# Patient Record
Sex: Male | Born: 1935 | Race: Black or African American | Hispanic: No | Marital: Married
Health system: Southern US, Community
[De-identification: ages and names within clinical notes are randomized; demographics above are authoritative.]

---

## 1998-01-12 ENCOUNTER — Encounter: Payer: Self-pay | Admitting: Internal Medicine

## 1998-01-12 ENCOUNTER — Ambulatory Visit (HOSPITAL_COMMUNITY): Admission: RE | Admit: 1998-01-12 | Discharge: 1998-01-12 | Payer: Self-pay | Admitting: Internal Medicine

## 2000-05-28 ENCOUNTER — Encounter: Admission: RE | Admit: 2000-05-28 | Discharge: 2000-05-28 | Payer: Self-pay | Admitting: Internal Medicine

## 2000-05-28 ENCOUNTER — Encounter: Payer: Self-pay | Admitting: Internal Medicine

## 2000-08-14 ENCOUNTER — Inpatient Hospital Stay (HOSPITAL_COMMUNITY): Admission: AD | Admit: 2000-08-14 | Discharge: 2000-08-17 | Payer: Self-pay | Admitting: Internal Medicine

## 2000-08-15 ENCOUNTER — Encounter: Payer: Self-pay | Admitting: Internal Medicine

## 2000-08-16 ENCOUNTER — Encounter: Payer: Self-pay | Admitting: Internal Medicine

## 2004-01-19 ENCOUNTER — Encounter: Admission: RE | Admit: 2004-01-19 | Discharge: 2004-01-19 | Payer: Self-pay | Admitting: Internal Medicine

## 2004-01-27 ENCOUNTER — Encounter: Admission: RE | Admit: 2004-01-27 | Discharge: 2004-01-27 | Payer: Self-pay | Admitting: Internal Medicine

## 2004-03-01 ENCOUNTER — Encounter (INDEPENDENT_AMBULATORY_CARE_PROVIDER_SITE_OTHER): Payer: Self-pay | Admitting: Specialist

## 2004-03-01 ENCOUNTER — Ambulatory Visit (HOSPITAL_COMMUNITY): Admission: RE | Admit: 2004-03-01 | Discharge: 2004-03-01 | Payer: Self-pay | Admitting: Gastroenterology

## 2004-03-21 ENCOUNTER — Ambulatory Visit (HOSPITAL_COMMUNITY): Admission: RE | Admit: 2004-03-21 | Discharge: 2004-03-21 | Payer: Self-pay | Admitting: Internal Medicine

## 2004-03-21 ENCOUNTER — Inpatient Hospital Stay (HOSPITAL_COMMUNITY): Admission: EM | Admit: 2004-03-21 | Discharge: 2004-03-28 | Payer: Self-pay | Admitting: Emergency Medicine

## 2004-03-24 ENCOUNTER — Ambulatory Visit: Payer: Self-pay | Admitting: Internal Medicine

## 2004-04-12 ENCOUNTER — Ambulatory Visit (HOSPITAL_COMMUNITY): Admission: RE | Admit: 2004-04-12 | Discharge: 2004-04-12 | Payer: Self-pay | Admitting: Urology

## 2004-05-17 ENCOUNTER — Ambulatory Visit (HOSPITAL_COMMUNITY): Admission: RE | Admit: 2004-05-17 | Discharge: 2004-05-17 | Payer: Self-pay | Admitting: Urology

## 2004-10-24 ENCOUNTER — Emergency Department (HOSPITAL_COMMUNITY): Admission: EM | Admit: 2004-10-24 | Discharge: 2004-10-24 | Payer: Self-pay | Admitting: Emergency Medicine

## 2004-10-31 ENCOUNTER — Inpatient Hospital Stay (HOSPITAL_COMMUNITY): Admission: EM | Admit: 2004-10-31 | Discharge: 2004-11-24 | Payer: Self-pay | Admitting: Internal Medicine

## 2004-11-11 ENCOUNTER — Ambulatory Visit: Payer: Self-pay | Admitting: Internal Medicine

## 2005-05-05 ENCOUNTER — Encounter: Admission: RE | Admit: 2005-05-05 | Discharge: 2005-05-05 | Payer: Self-pay | Admitting: Internal Medicine

## 2005-09-05 ENCOUNTER — Inpatient Hospital Stay (HOSPITAL_COMMUNITY): Admission: AD | Admit: 2005-09-05 | Discharge: 2005-09-25 | Payer: Self-pay | Admitting: Internal Medicine

## 2005-09-06 ENCOUNTER — Encounter (INDEPENDENT_AMBULATORY_CARE_PROVIDER_SITE_OTHER): Payer: Self-pay | Admitting: Interventional Cardiology

## 2005-11-09 ENCOUNTER — Inpatient Hospital Stay (HOSPITAL_COMMUNITY): Admission: EM | Admit: 2005-11-09 | Discharge: 2005-11-16 | Payer: Self-pay | Admitting: Emergency Medicine

## 2006-01-05 ENCOUNTER — Inpatient Hospital Stay (HOSPITAL_COMMUNITY): Admission: EM | Admit: 2006-01-05 | Discharge: 2006-01-12 | Payer: Self-pay | Admitting: Emergency Medicine

## 2006-03-11 ENCOUNTER — Inpatient Hospital Stay (HOSPITAL_COMMUNITY): Admission: EM | Admit: 2006-03-11 | Discharge: 2006-03-21 | Payer: Self-pay | Admitting: Emergency Medicine

## 2006-03-12 ENCOUNTER — Encounter (INDEPENDENT_AMBULATORY_CARE_PROVIDER_SITE_OTHER): Payer: Self-pay | Admitting: Cardiology

## 2006-04-18 ENCOUNTER — Inpatient Hospital Stay (HOSPITAL_COMMUNITY): Admission: EM | Admit: 2006-04-18 | Discharge: 2006-04-30 | Payer: Self-pay | Admitting: Emergency Medicine

## 2006-04-19 ENCOUNTER — Encounter: Payer: Self-pay | Admitting: Vascular Surgery

## 2006-04-19 ENCOUNTER — Ambulatory Visit: Payer: Self-pay | Admitting: Vascular Surgery

## 2006-05-29 ENCOUNTER — Ambulatory Visit (HOSPITAL_COMMUNITY): Admission: RE | Admit: 2006-05-29 | Discharge: 2006-05-29 | Payer: Self-pay | Admitting: Internal Medicine

## 2006-12-01 ENCOUNTER — Emergency Department (HOSPITAL_COMMUNITY): Admission: EM | Admit: 2006-12-01 | Discharge: 2006-12-02 | Payer: Self-pay | Admitting: Emergency Medicine

## 2006-12-15 ENCOUNTER — Emergency Department (HOSPITAL_COMMUNITY): Admission: EM | Admit: 2006-12-15 | Discharge: 2006-12-15 | Payer: Self-pay | Admitting: Emergency Medicine

## 2007-01-03 ENCOUNTER — Ambulatory Visit: Payer: Self-pay | Admitting: Internal Medicine

## 2007-01-03 ENCOUNTER — Inpatient Hospital Stay (HOSPITAL_COMMUNITY): Admission: EM | Admit: 2007-01-03 | Discharge: 2007-01-17 | Payer: Self-pay | Admitting: Emergency Medicine

## 2007-01-15 ENCOUNTER — Ambulatory Visit: Payer: Self-pay | Admitting: Internal Medicine

## 2007-01-16 ENCOUNTER — Encounter: Payer: Self-pay | Admitting: Internal Medicine

## 2007-02-27 ENCOUNTER — Ambulatory Visit: Payer: Self-pay

## 2007-08-23 ENCOUNTER — Inpatient Hospital Stay (HOSPITAL_COMMUNITY): Admission: EM | Admit: 2007-08-23 | Discharge: 2007-08-30 | Payer: Self-pay | Admitting: Emergency Medicine

## 2007-11-18 ENCOUNTER — Emergency Department (HOSPITAL_COMMUNITY): Admission: EM | Admit: 2007-11-18 | Discharge: 2007-11-18 | Payer: Self-pay | Admitting: Emergency Medicine

## 2008-02-10 ENCOUNTER — Inpatient Hospital Stay (HOSPITAL_COMMUNITY): Admission: AD | Admit: 2008-02-10 | Discharge: 2008-02-19 | Payer: Self-pay | Admitting: Internal Medicine

## 2008-02-12 ENCOUNTER — Encounter: Payer: Self-pay | Admitting: Internal Medicine

## 2008-02-14 ENCOUNTER — Ambulatory Visit: Payer: Self-pay | Admitting: Vascular Surgery

## 2008-05-12 ENCOUNTER — Inpatient Hospital Stay (HOSPITAL_COMMUNITY): Admission: EM | Admit: 2008-05-12 | Discharge: 2008-05-22 | Payer: Self-pay | Admitting: Emergency Medicine

## 2008-05-12 ENCOUNTER — Encounter: Payer: Self-pay | Admitting: Internal Medicine

## 2008-05-18 ENCOUNTER — Encounter (INDEPENDENT_AMBULATORY_CARE_PROVIDER_SITE_OTHER): Payer: Self-pay | Admitting: *Deleted

## 2008-06-11 ENCOUNTER — Ambulatory Visit: Payer: Self-pay | Admitting: Vascular Surgery

## 2008-07-04 ENCOUNTER — Inpatient Hospital Stay (HOSPITAL_COMMUNITY): Admission: EM | Admit: 2008-07-04 | Discharge: 2008-07-08 | Payer: Self-pay | Admitting: Emergency Medicine

## 2008-08-30 DEATH — deceased

## 2009-01-01 ENCOUNTER — Encounter (INDEPENDENT_AMBULATORY_CARE_PROVIDER_SITE_OTHER): Payer: Self-pay | Admitting: *Deleted

## 2009-08-27 ENCOUNTER — Encounter (INDEPENDENT_AMBULATORY_CARE_PROVIDER_SITE_OTHER): Payer: Self-pay | Admitting: *Deleted

## 2010-02-06 IMAGING — CT CT HEAD W/O CM
1 of 2 series · 14 of 30 positions shown, 18 images · non-contrast
Comparison: Head CT scan 05/11/2008 and 02/10/2008.

CLINICAL DATA: Altered mental status.  Patient unresponsive.

CT HEAD WITHOUT CONTRAST
TECHNIQUE: Contiguous axial images were obtained from the base of
the skull through the vertex without contrast.

[Series 2: head_seq 4.5 h37s st · axial · 0.43mm/px · z∈[-146,-11]mm · 14 of 36 slices shown, 18 images]
[im 3/36  brain]
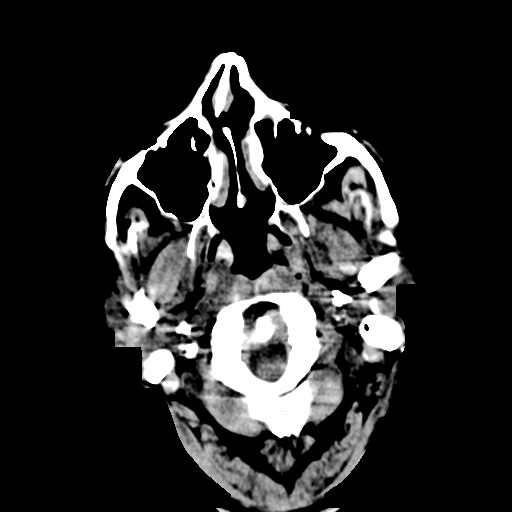
[im 3/36  bone]
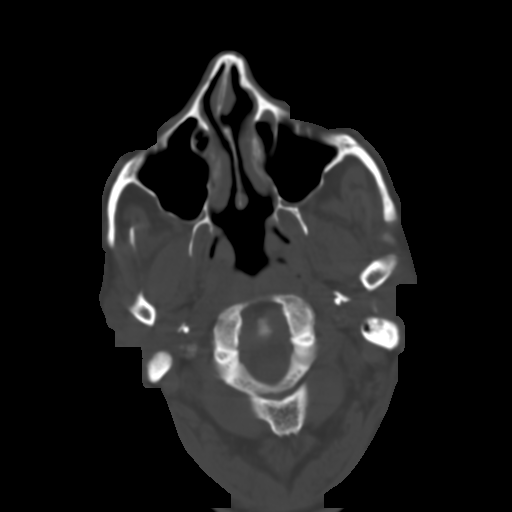
[im 5/36  brain]
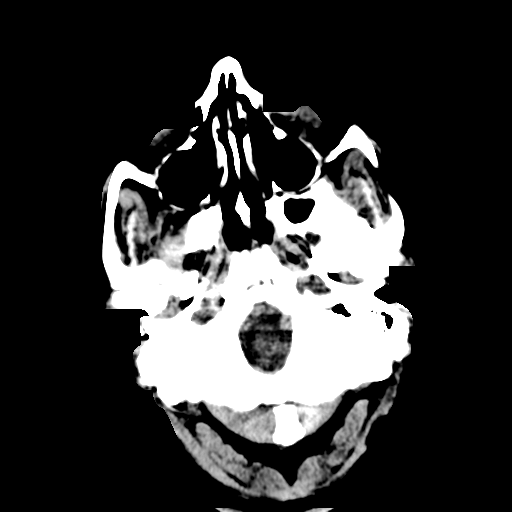
[im 8/36  brain]
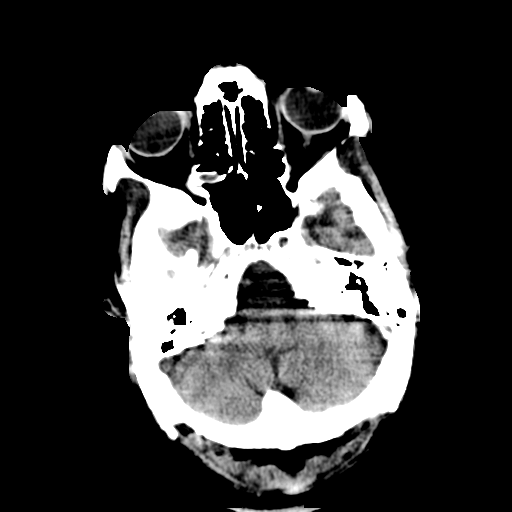
[im 10/36  brain]
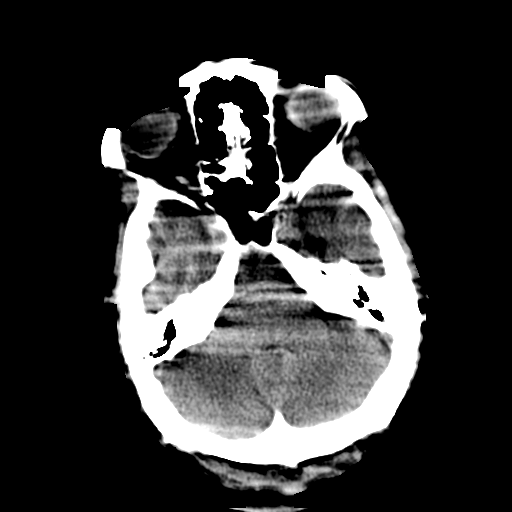
[im 12/36  brain]
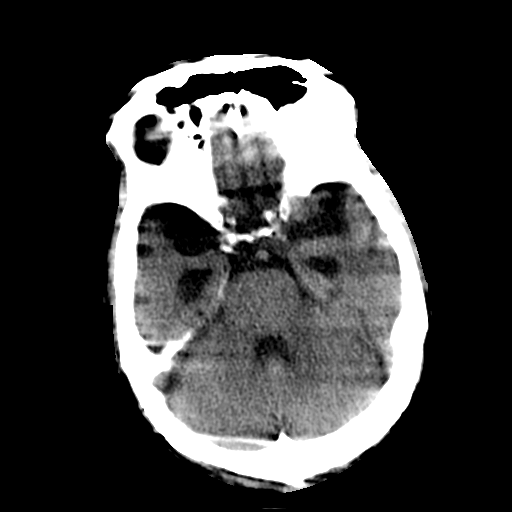
[im 12/36  bone]
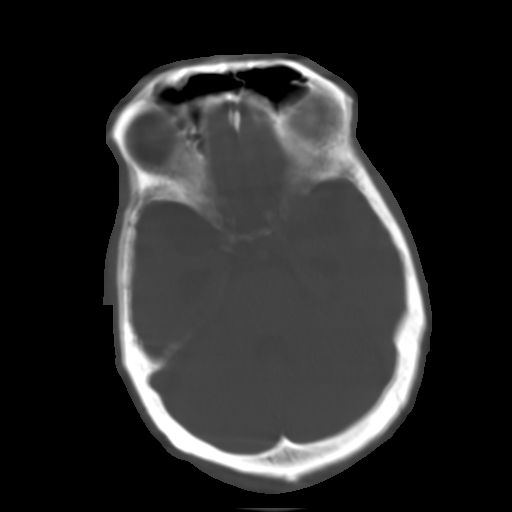
[im 15/36  brain]
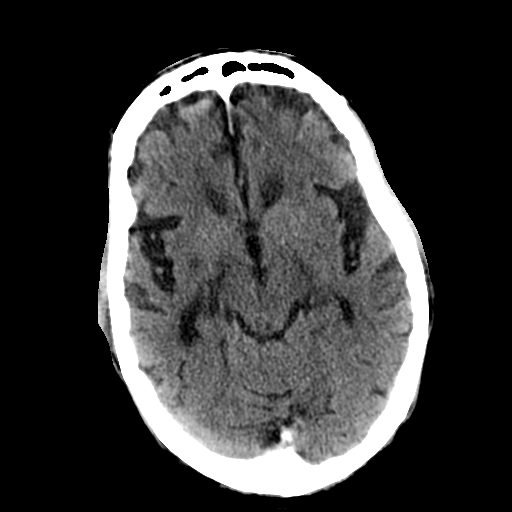
[im 17/36  brain]
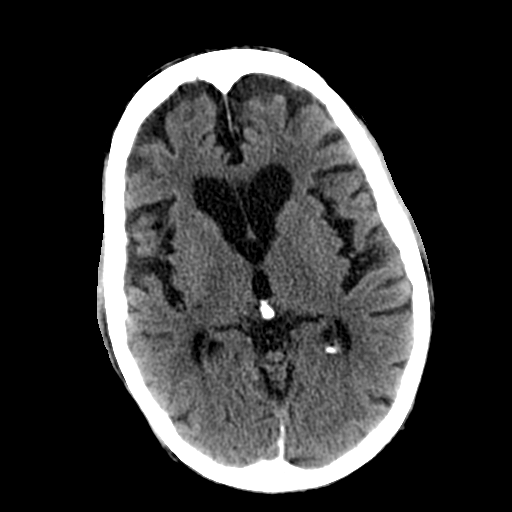
[im 19/36  brain]
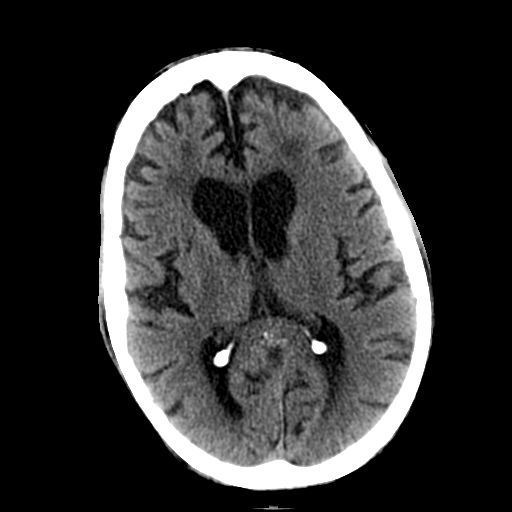
[im 22/36  brain]
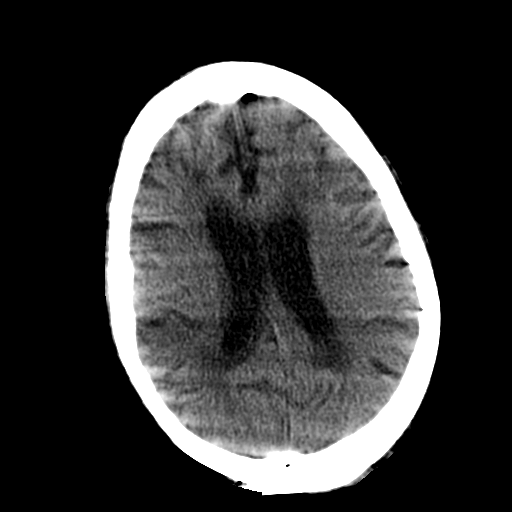
[im 22/36  bone]
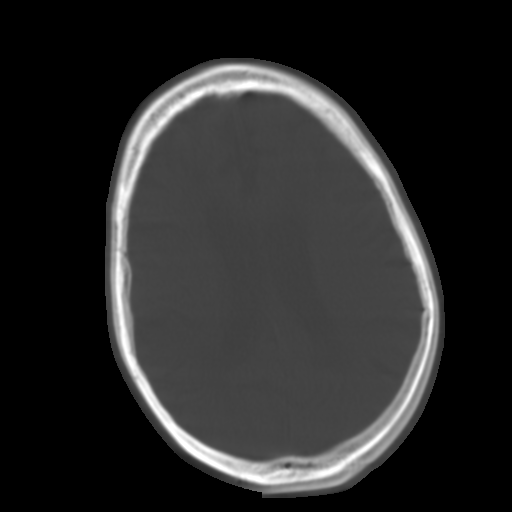
[im 24/36  brain]
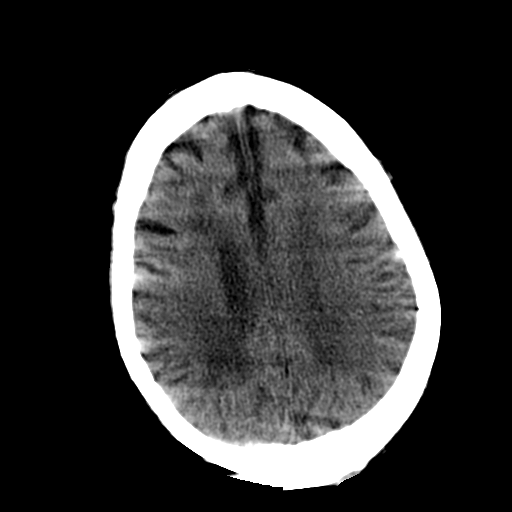
[im 26/36  brain]
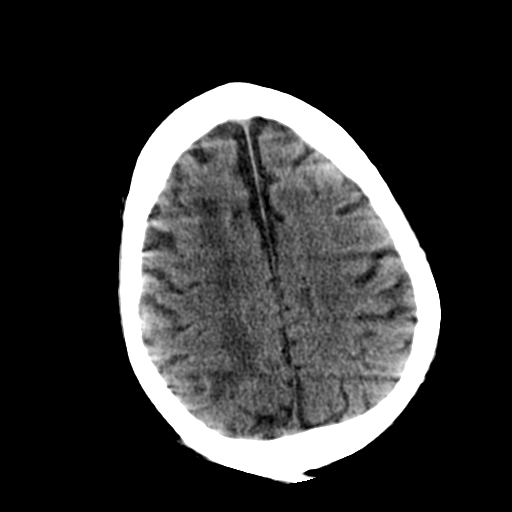
[im 29/36  brain]
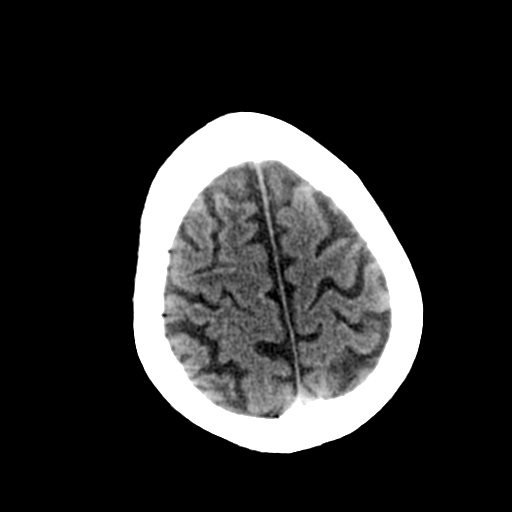
[im 31/36  brain]
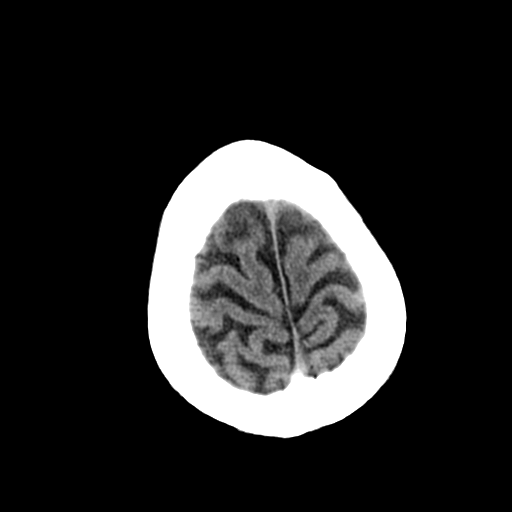
[im 31/36  bone]
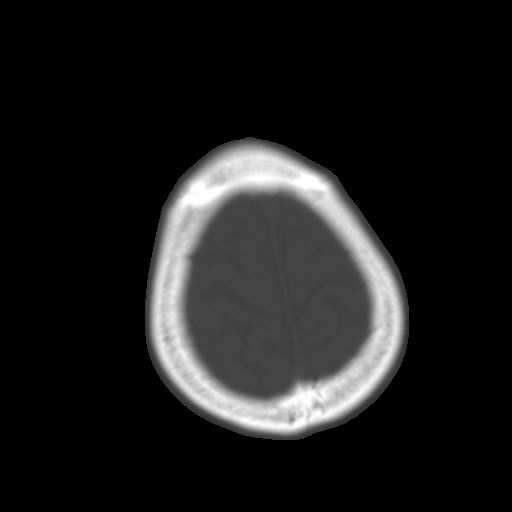
[im 33/36  brain]
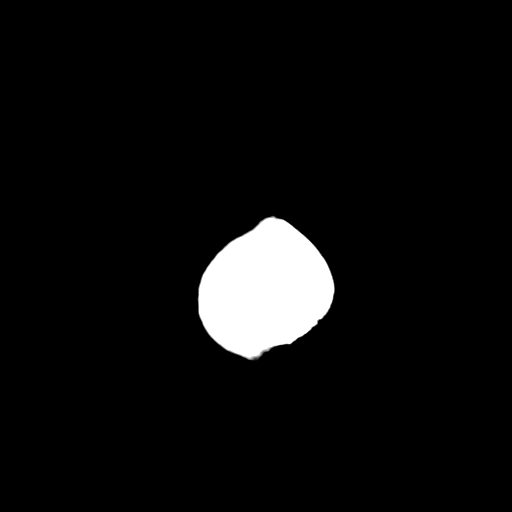

[14 of 30 positions shown; findings below may reference images not displayed]

FINDINGS: The brain is atrophic with a pattern of chronic
microvascular ischemic change.  Remote infarct of the posterior
high right parietal lobe is noted.  No evidence of acute
intracranial abnormality including acute infarction, hemorrhage,
mass lesion, mass effect, midline shift or abnormal extra-axial
fluid collection is identified.  There is no hydrocephalus.  Imaged
paranasal sinuses and mastoid air cells are clear.
IMPRESSION: 1.  No acute finding.
2.  Atrophy, chronic microvascular ischemic change and small,
remote right parietal infarct.

## 2010-02-24 ENCOUNTER — Encounter (INDEPENDENT_AMBULATORY_CARE_PROVIDER_SITE_OTHER): Payer: Self-pay | Admitting: *Deleted

## 2010-03-03 NOTE — Letter (Signed)
Summary: Appointment - Reminder 2  [facility], Main Office  [address]   [place], [place] [address]   Phone: [phone]  Fax: [phone]     August 27, 2009 MRN: [id number]   [name] [place] [address] [place], [place]  [address]   Dear [name],  Our records indicate that it is time to schedule a follow-up appointment.Dr.Taylor(pacer check)recommended that you follow up with [place]. It is very important that we reach you to schedule this appointment. We look forward to participating in your health care needs. Please contact us at the number listed above at your earliest convenience to schedule your appointment.  If you are unable to make an appointment at this time, give [place] a call so we can update our records.     Sincerely,  Ruel Favors Scheduling Team

## 2010-03-03 NOTE — Letter (Signed)
Summary: Device-Delinquent Check  [facility], Main Office  [address]   [place], [place] [address]   Phone: [phone]  Fax: [phone]     February 24, 2010 MRN: [id number]   [facility] [address] [place], [place]  [address]   Dear Mr. Azer,  According to our records, you have not had your implanted device checked in the recommended period of time.  We are unable to determine appropriate device function without checking your device on a regular basis.  Please call our office to schedule an appointment, with Dr Ladona Ridgel,  as soon as possible.  If you are having your device checked by another physician, please call us so that we may update our records.  Thank you,  Letta Moynahan, EMT  February 24, 2010 12:38 PM  [facility] certified

## 2010-03-15 ENCOUNTER — Encounter: Payer: Self-pay | Admitting: Internal Medicine

## 2010-03-29 NOTE — Cardiovascular Report (Signed)
Summary: Certified Letter Signed - Patient (not doing f/u)  Certified Letter Signed - Patient (not doing f/u)   Imported By: Debby Freiberg 03/23/2010 13:19:45  _____________________________________________________________________  External Attachment:    Type:   Image     Comment:   External Document

## 2010-05-09 LAB — COMPREHENSIVE METABOLIC PANEL
ALT: 16 U/L (ref 0–53)
AST: 26 U/L (ref 0–37)
Albumin: 3.4 g/dL — ABNORMAL LOW (ref 3.5–5.2)
Calcium: 9.5 mg/dL (ref 8.4–10.5)
GFR calc Af Amer: 51 mL/min — ABNORMAL LOW (ref >60)
Glucose, Bld: 172 mg/dL — ABNORMAL HIGH (ref 70–99)
Potassium: 4.3 mEq/L (ref 3.5–5.1)
Sodium: 138 mEq/L (ref 135–145)
Total Protein: 7 g/dL (ref 6.0–8.3)

## 2010-05-09 LAB — DIFFERENTIAL
Eosinophils Absolute: 0 10*3/uL (ref 0.0–0.7)
Lymphs Abs: 0.7 10*3/uL (ref 0.7–4.0)
Monocytes Absolute: 0.4 10*3/uL (ref 0.1–1.0)
Monocytes Relative: 6 % (ref 3–12)
Neutrophils Relative %: 83 % — ABNORMAL HIGH (ref 43–77)

## 2010-05-09 LAB — BASIC METABOLIC PANEL
BUN: 16 mg/dL (ref 6–23)
CO2: 25 mEq/L (ref 19–32)
CO2: 27 mEq/L (ref 19–32)
CO2: 27 mEq/L (ref 19–32)
CO2: 29 mEq/L (ref 19–32)
Calcium: 9.1 mg/dL (ref 8.4–10.5)
Calcium: 9.5 mg/dL (ref 8.4–10.5)
Calcium: 9.7 mg/dL (ref 8.4–10.5)
Chloride: 109 mEq/L (ref 96–112)
Creatinine, Ser: 1.41 mg/dL (ref 0.4–1.5)
Creatinine, Ser: 1.59 mg/dL — ABNORMAL HIGH (ref 0.4–1.5)
GFR calc Af Amer: 58 mL/min — ABNORMAL LOW (ref >60)
Glucose, Bld: 104 mg/dL — ABNORMAL HIGH (ref 70–99)
Glucose, Bld: 158 mg/dL — ABNORMAL HIGH (ref 70–99)
Glucose, Bld: 65 mg/dL — ABNORMAL LOW (ref 70–99)
Glucose, Bld: 78 mg/dL (ref 70–99)
Potassium: 4.1 mEq/L (ref 3.5–5.1)
Sodium: 143 mEq/L (ref 135–145)
Sodium: 143 mEq/L (ref 135–145)
Sodium: 144 mEq/L (ref 135–145)

## 2010-05-09 LAB — CBC
HCT: 32.1 % — ABNORMAL LOW (ref 39.0–52.0)
Hemoglobin: 10.2 g/dL — ABNORMAL LOW (ref 13.0–17.0)
Hemoglobin: 12.7 g/dL — ABNORMAL LOW (ref 13.0–17.0)
MCHC: 30.9 g/dL (ref 30.0–36.0)
MCHC: 31.7 g/dL (ref 30.0–36.0)
MCHC: 31.8 g/dL (ref 30.0–36.0)
MCHC: 32.7 g/dL (ref 30.0–36.0)
MCV: 86.4 fL (ref 78.0–100.0)
Platelets: 245 10*3/uL (ref 150–400)
Platelets: 270 10*3/uL (ref 150–400)
RDW: 14.6 % (ref 11.5–15.5)
RDW: 15.1 % (ref 11.5–15.5)
RDW: 15.2 % (ref 11.5–15.5)
RDW: 15.3 % (ref 11.5–15.5)

## 2010-05-09 LAB — URINE MICROSCOPIC-ADD ON

## 2010-05-09 LAB — URINALYSIS, ROUTINE W REFLEX MICROSCOPIC
Glucose, UA: NEGATIVE mg/dL
Hgb urine dipstick: NEGATIVE
Leukocytes, UA: NEGATIVE
pH: 5 (ref 5.0–8.0)

## 2010-05-09 LAB — CARDIAC PANEL(CRET KIN+CKTOT+MB+TROPI)
CK, MB: 4.9 ng/mL — ABNORMAL HIGH (ref 0.3–4.0)
Total CK: 275 U/L — ABNORMAL HIGH (ref 7–232)

## 2010-05-09 LAB — HEMOGLOBIN A1C
Hgb A1c MFr Bld: 6.7 % — ABNORMAL HIGH (ref 4.6–6.1)
Mean Plasma Glucose: 146 mg/dL

## 2010-05-09 LAB — GLUCOSE, CAPILLARY: Glucose-Capillary: 123 mg/dL — ABNORMAL HIGH (ref 70–99)

## 2010-05-11 LAB — COMPREHENSIVE METABOLIC PANEL
ALT: 16 U/L (ref 0–53)
ALT: 20 U/L (ref 0–53)
AST: 21 U/L (ref 0–37)
AST: 33 U/L (ref 0–37)
Albumin: 2.7 g/dL — ABNORMAL LOW (ref 3.5–5.2)
Albumin: 2.7 g/dL — ABNORMAL LOW (ref 3.5–5.2)
Alkaline Phosphatase: 44 U/L (ref 39–117)
Alkaline Phosphatase: 37 U/L — ABNORMAL LOW (ref 39–117)
Alkaline Phosphatase: 44 U/L (ref 39–117)
BUN: 12 mg/dL (ref 6–23)
BUN: 30 mg/dL — ABNORMAL HIGH (ref 6–23)
CO2: 32 mEq/L (ref 19–32)
CO2: 27 mEq/L (ref 19–32)
CO2: 27 mEq/L (ref 19–32)
Calcium: 9.1 mg/dL (ref 8.4–10.5)
Chloride: 103 mEq/L (ref 96–112)
Chloride: 105 mEq/L (ref 96–112)
Chloride: 114 mEq/L — ABNORMAL HIGH (ref 96–112)
Chloride: 119 mEq/L — ABNORMAL HIGH (ref 96–112)
Creatinine, Ser: 1.27 mg/dL (ref 0.4–1.5)
Creatinine, Ser: 1.54 mg/dL — ABNORMAL HIGH (ref 0.4–1.5)
GFR calc Af Amer: >60 mL/min (ref >60)
GFR calc Af Amer: 53 mL/min — ABNORMAL LOW (ref >60)
GFR calc non Af Amer: 44 mL/min — ABNORMAL LOW (ref >60)
GFR calc non Af Amer: 54 mL/min — ABNORMAL LOW (ref >60)
Glucose, Bld: 181 mg/dL — ABNORMAL HIGH (ref 70–99)
Glucose, Bld: 151 mg/dL — ABNORMAL HIGH (ref 70–99)
Potassium: 3.7 mEq/L (ref 3.5–5.1)
Potassium: 3.7 mEq/L (ref 3.5–5.1)
Potassium: 3.8 mEq/L (ref 3.5–5.1)
Sodium: 140 mEq/L (ref 135–145)
Sodium: 148 mEq/L — ABNORMAL HIGH (ref 135–145)
Total Bilirubin: 0.4 mg/dL (ref 0.3–1.2)
Total Bilirubin: 0.4 mg/dL (ref 0.3–1.2)
Total Bilirubin: 0.5 mg/dL (ref 0.3–1.2)
Total Protein: 6.4 g/dL (ref 6.0–8.3)
Total Protein: 5.8 g/dL — ABNORMAL LOW (ref 6.0–8.3)

## 2010-05-11 LAB — HEPATIC FUNCTION PANEL
ALT: 16 U/L (ref 0–53)
Bilirubin, Direct: 0.3 mg/dL (ref 0.0–0.3)
Indirect Bilirubin: 0.6 mg/dL (ref 0.3–0.9)
Total Protein: 7.8 g/dL (ref 6.0–8.3)

## 2010-05-11 LAB — URINALYSIS, ROUTINE W REFLEX MICROSCOPIC
Protein, ur: >300 mg/dL — AB
Urobilinogen, UA: 1 mg/dL (ref 0.0–1.0)

## 2010-05-11 LAB — BASIC METABOLIC PANEL
BUN: 12 mg/dL (ref 6–23)
CO2: 29 mEq/L (ref 19–32)
CO2: 27 mEq/L (ref 19–32)
Calcium: 8.9 mg/dL (ref 8.4–10.5)
Calcium: 9.2 mg/dL (ref 8.4–10.5)
Creatinine, Ser: 1.19 mg/dL (ref 0.4–1.5)
GFR calc Af Amer: >60 mL/min (ref >60)
GFR calc Af Amer: 39 mL/min — ABNORMAL LOW (ref >60)
GFR calc non Af Amer: 32 mL/min — ABNORMAL LOW (ref >60)
Potassium: 4.5 mEq/L (ref 3.5–5.1)
Sodium: 155 mEq/L — ABNORMAL HIGH (ref 135–145)

## 2010-05-11 LAB — POCT CARDIAC MARKERS
CKMB, poc: 6.7 ng/mL (ref 1.0–8.0)
Myoglobin, poc: >500 ng/mL (ref 12–200)

## 2010-05-11 LAB — CBC
HCT: 45.1 % (ref 39.0–52.0)
MCV: 88.8 fL (ref 78.0–100.0)
MCV: 90.4 fL (ref 78.0–100.0)
Platelets: 215 10*3/uL (ref 150–400)
Platelets: 237 10*3/uL (ref 150–400)
RDW: 13.1 % (ref 11.5–15.5)
RDW: 14.3 % (ref 11.5–15.5)
WBC: 6.9 10*3/uL (ref 4.0–10.5)
WBC: 15 10*3/uL — ABNORMAL HIGH (ref 4.0–10.5)

## 2010-05-11 LAB — DIFFERENTIAL
Basophils Absolute: 0 10*3/uL (ref 0.0–0.1)
Eosinophils Absolute: 0 10*3/uL (ref 0.0–0.7)
Eosinophils Relative: 0 % (ref 0–5)
Lymphs Abs: 0.6 10*3/uL — ABNORMAL LOW (ref 0.7–4.0)
Neutrophils Relative %: 90 % — ABNORMAL HIGH (ref 43–77)

## 2010-05-11 LAB — URINE MICROSCOPIC-ADD ON

## 2010-05-11 LAB — GLUCOSE, CAPILLARY
Glucose-Capillary: 122 mg/dL — ABNORMAL HIGH (ref 70–99)
Glucose-Capillary: 177 mg/dL — ABNORMAL HIGH (ref 70–99)

## 2010-05-11 LAB — URINE CULTURE: Colony Count: >=100000

## 2010-05-11 LAB — DIGOXIN LEVEL: Digoxin Level: <0.2 ng/mL — ABNORMAL LOW (ref 0.8–2.0)

## 2010-05-11 LAB — CK TOTAL AND CKMB (NOT AT ARMC)
CK, MB: 7.5 ng/mL — ABNORMAL HIGH (ref 0.3–4.0)
CK, MB: UNDETERMINED ng/mL (ref 0.3–4.0)
Relative Index: 0.5 (ref 0.0–2.5)
Relative Index: 0.6 (ref 0.0–2.5)
Relative Index: UNDETERMINED (ref 0.0–2.5)
Total CK: 459 U/L — ABNORMAL HIGH (ref 7–232)
Total CK: 1244 U/L — ABNORMAL HIGH (ref 7–232)
Total CK: 2175 U/L — ABNORMAL HIGH (ref 7–232)

## 2010-05-11 LAB — CULTURE, BLOOD (ROUTINE X 2): Culture: NO GROWTH

## 2010-05-11 LAB — CARDIAC PANEL(CRET KIN+CKTOT+MB+TROPI): CK, MB: 6.1 ng/mL — ABNORMAL HIGH (ref 0.3–4.0)

## 2010-05-11 LAB — POCT I-STAT, CHEM 8
BUN: 63 mg/dL — ABNORMAL HIGH (ref 6–23)
Calcium, Ion: 1.11 mmol/L — ABNORMAL LOW (ref 1.12–1.32)
HCT: 47 % (ref 39.0–52.0)
Hemoglobin: 16 g/dL (ref 13.0–17.0)
Sodium: 155 mEq/L — ABNORMAL HIGH (ref 135–145)
TCO2: 25 mmol/L (ref 0–100)

## 2010-05-11 LAB — D-DIMER, QUANTITATIVE: D-Dimer, Quant: 3.64 ug/mL-FEU — ABNORMAL HIGH (ref 0.00–0.48)

## 2010-05-11 LAB — AMMONIA: Ammonia: 79 umol/L — ABNORMAL HIGH (ref 11–35)

## 2010-05-16 LAB — COMPREHENSIVE METABOLIC PANEL
AST: 18 U/L (ref 0–37)
Albumin: 3.8 g/dL (ref 3.5–5.2)
Alkaline Phosphatase: 46 U/L (ref 39–117)
BUN: 17 mg/dL (ref 6–23)
BUN: 29 mg/dL — ABNORMAL HIGH (ref 6–23)
CO2: 25 mEq/L (ref 19–32)
Chloride: 101 mEq/L (ref 96–112)
Chloride: 109 mEq/L (ref 96–112)
Creatinine, Ser: 1.4 mg/dL (ref 0.4–1.5)
GFR calc Af Amer: 54 mL/min — ABNORMAL LOW (ref >60)
GFR calc non Af Amer: 49 mL/min — ABNORMAL LOW (ref >60)
Glucose, Bld: 114 mg/dL — ABNORMAL HIGH (ref 70–99)
Potassium: 4.1 mEq/L (ref 3.5–5.1)
Sodium: 145 mEq/L (ref 135–145)
Total Bilirubin: 0.5 mg/dL (ref 0.3–1.2)
Total Bilirubin: 0.6 mg/dL (ref 0.3–1.2)
Total Protein: 6.8 g/dL (ref 6.0–8.3)

## 2010-05-16 LAB — URINE MICROSCOPIC-ADD ON

## 2010-05-16 LAB — GLUCOSE, CAPILLARY: Glucose-Capillary: 147 mg/dL — ABNORMAL HIGH (ref 70–99)

## 2010-05-16 LAB — BASIC METABOLIC PANEL
BUN: 14 mg/dL (ref 6–23)
Calcium: 9.2 mg/dL (ref 8.4–10.5)
Chloride: 107 mEq/L (ref 96–112)
Creatinine, Ser: 1.4 mg/dL (ref 0.4–1.5)
GFR calc non Af Amer: 49 mL/min — ABNORMAL LOW (ref >60)

## 2010-05-16 LAB — URINALYSIS, ROUTINE W REFLEX MICROSCOPIC
Glucose, UA: NEGATIVE mg/dL
Nitrite: NEGATIVE
Specific Gravity, Urine: 1.027 (ref 1.005–1.030)
pH: 6.5 (ref 5.0–8.0)

## 2010-05-16 LAB — CBC
HCT: 39 % (ref 39.0–52.0)
HCT: 41.9 % (ref 39.0–52.0)
MCHC: 32.1 g/dL (ref 30.0–36.0)
MCV: 91.5 fL (ref 78.0–100.0)
MCV: 90.6 fL (ref 78.0–100.0)
Platelets: 246 10*3/uL (ref 150–400)
Platelets: 212 10*3/uL (ref 150–400)
RBC: 4.58 MIL/uL (ref 4.22–5.81)
RDW: 15 % (ref 11.5–15.5)
WBC: 4.8 10*3/uL (ref 4.0–10.5)
WBC: 5.1 10*3/uL (ref 4.0–10.5)
WBC: 4 10*3/uL (ref 4.0–10.5)

## 2010-05-16 LAB — PROTIME-INR: INR: 1.2 (ref 0.00–1.49)

## 2010-05-16 LAB — BRAIN NATRIURETIC PEPTIDE: Pro B Natriuretic peptide (BNP): 618 pg/mL — ABNORMAL HIGH (ref 0.0–100.0)

## 2010-06-14 NOTE — H&P (Signed)
[name], [name]             ACCOUNT NO.:  [id number]   MEDICAL RECORD NO.:  [id number]          PATIENT TYPE:  INP   LOCATION:  1312                         FACILITY:  [facility]   PHYSICIAN:  Vania Rea, M.D. DATE OF BIRTH:  [birth year]   DATE OF ADMISSION:  07/04/2008  DATE OF DISCHARGE:                              HISTORY & PHYSICAL   PRIMARY CARE PHYSICIANS:  1. Maxwell Caul, M.D. at [facility]  2. Lind Guest. August Saucer, M.D.   CHIEF COMPLAINT:  Altered mental status.   HISTORY OF PRESENT ILLNESS:  This is an 77 year old African American  resident of [facility] whose  diagnoses include  advanced dementia, who has been in progressive decline over the past 2  weeks and is no longer able to feed himself, but does take a pureed  diet.  His family decided to bring him to the emergency room today for  mental status changes.  He was evaluated by the emergency room physician  and found to have a lung infiltrate suspicious for pneumonia and the  Hospitalist Service called to assist.   The patient's code status is do not resuscitate .  Notations in the  patient's nursing home records indicate do not hospitalize and a  Palliative Care consult was sought on Jun 02, 2008, further results of  this consult is not known.  The patient was last discharged from this  facility on May 22, 2008 after treatment for urinary tract infection,  severe dementia, metabolic encephalopathy, sacral decubitus ulcers and  was discharged to a nursing home for further care.  The patient's  condition was discussed with his family and they have indicated that  although they do not wish him to be on life support or to be overly  aggressively treated, they would like him to be admitted if he can be  helped.   There is no history of fever, cough, cold or chest pain.  The patient  has become progressively unresponsive.   PAST MEDICAL HISTORY:  1. History of sacral decubitus  ulcers.  2. History of malnutrition.  3. History of nonsustained sinus tachycardia.  4. Nonischemic cardiomyopathy.  5. Alzheimer's dementia.  6. Renal insufficiency, improved.  7. Dehydration.  8. History of urinary tract infection.  9. Past history of pneumonia.  10.Past history of CHF.  11.History of diabetes.  12.History of prostate cancer.   MEDICATIONS:  1. Digoxin 0.125 mg daily.  2. Aspirin 81 mg daily.  3. Multivitamins with minerals 1 daily.  4. Vitamin C 500 mg twice daily.  5. Zinc 220 mg daily.  6. Lopressor 12.5 mg twice daily.  7. Nitroglycerin patch 0.2 mg per hour applied at 8 a.m. and removed      at 8 p.m.  8. Lactulose 30 mg daily.  9. Prilosec 20 mg at bedtime.  10.Tylenol 650 mg every 4 hours p.r.n.  11.Haldol 1 mg at bedtime p.r.n. for agitation.   ALLERGIES:  NO KNOWN DRUG ALLERGIES.   SOCIAL HISTORY:  The patient is a nursing home resident.  He used to  live with his wife, but  in the past 2 months, has been confined to a  nursing home.  There is no history of alcohol or illicit drug use.   FAMILY HISTORY:  Unable to obtain.   REVIEW OF SYSTEMS:  Due to the patient's mental status, other than noted  above, unable to obtain.   PHYSICAL EXAMINATION:  GENERAL:  Elderly African American gentleman  lying in the stretcher mouth breathing, has his mouth open, but is not  tachypneic or distressed.  He responds only to pain.  He  speaks, but  not intelligibly or clearly.  He does not appear to be in pain.  VITAL SIGNS:  Temperature is 97.7 in his axilla, pulse 73 and irregular,  respirations 17, blood pressure 114/68.  He is saturating at 100% on 2  liters.  HEENT:  His pupils are small and pinpoint.  Mucous membranes are pink.  He is moderately dehydrated.  No cervical lymphadenopathy or  thyromegaly.  CHEST:  Clear to auscultation bilaterally.  Left subclavian Port-A-Cath.  CARDIOVASCULAR:  Irregular rhythm.  ABDOMEN:  Soft and nontender.  No  masses.  EXTREMITIES:  Without edema.  He has arthritic deformities of hands,  knees, ankles, especially the left knee.  He has hyperpigmented ischemic  changes of the toes of both feet, but toes are currently warm.  CENTRAL NERVOUS SYSTEM:  Difficult to assess because of the patient's  mental status.  He has no obvious facial asymmetry, but he does not  appear to move the left side of his body as much as the right.   LABORATORY DATA:  White count is 7.2, hemoglobin 10.3, platelets 245,  83% neutrophils, but absolute granulocyte count is normal at 6.0.  His  complete metabolic panel significant for sodium of 138, potassium 4.3,  chloride 103, CO2 28,000, BUN 23, creatinine 1.59, glucose 172,  albumin  is 3.4.  His liver functions are otherwise unremarkable.  His digoxin  level is 0.3.  Urinalysis is cloudy with a specific gravity of 1.025, 30  of protein, trace ketones, but otherwise unremarkable.  Urine microscopy  is negative for abnormal constituents.   DIAGNOSTICS:  Two-view chest x-rays shows a left upper lobe  bronchopneumonia or aspiration pneumonitis suspected.   ASSESSMENT:  1. Aspiration pneumonitis.  2. Dehydration as evidenced by his creatinine elevated above his      baseline at 1.2.  3. Failure to thrive.  4. Nonischemic cardiomyopathy.  5. History of stroke.  6. Dementia.   PLAN:  1. We will admit this gentleman for hydration and antibiotic therapy.      If he does not improve, we will clarify with the family how      aggressive they wish [place] to design his treatment to be.  2. Once the patient becomes more alert, we will resume his pureed      diet.  We will need to explain to his wife that he will continue to      be at risk for aspiration if he is not  swallowing properly and at      risk for dehydration if he is unable to intake adequate nutrition      at baseline.  Other plans as per orders.      Vania Rea, M.D.  Electronically Signed      LC/MEDQ  D:  07/04/2008  T:  07/04/2008  Job:  [id number]   cc:   Minerva Areola L. August Saucer, M.D.  Fax: [phone]   Maxwell Caul, M.D.

## 2010-06-14 NOTE — Procedures (Signed)
EEG NUMBER:  [id number]   REFERRING PHYSICIAN:  Theodosia Paling, MD   This is a portable EEG for this lethargic right-handed elderly gentleman  with a history of aspiration pneumonia, advanced dementia with  progressive decline physically and cognitively.  He also has a  cardiomyopathy, diabetes mellitus, hypertension, and is a pacemaker  patient.  Activating procedures were none included.  The patient is  described as lethargic and there is no baseline mental status note made  on the intake sheet.  If the patient, for example, followed instructions  or answers questions is not known to me.  The patient is currently on  the medications Nitro-Dur, Lanoxin, aspirin, Proscar, Lopressor,  Lovenox, Zosyn,  Santyl, Haldol, Zofran, and Tylenol.   A posterior dominant background rhythm is for much of this study not  elicited reveals the patient is not alert or awake and is mostly asleep  or deep stages of drowsiness.  For less than 5 minutes during this  recording, the patient shows a posterior dominant rhythm at about 6-7  Hz.  This is symmetric between both hemispheres and would constitute  generalized slowing.  There is also a remarkable irregular EKG with  occasional pacer complexes seen.  Epileptiform discharges were not seen  and again most of the study consist of sleep and due to the slow  baseline rhythm of his EEG, it appears to be in deep slow wave sleep.  Sleep architecture is symmetric.   CONCLUSION:  Abnormal EEG.  The patient's EEG shows generalized slowing  likely due to the static encephalopathy.  This can be seen in dementia  of advanced age.       Melvyn Novas, M.D.  Electronically Signed     VH:QION  D:  07/07/2008 17:29:10  T:  07/08/2008 07:28:05  Job #:  [id number]   cc:   Theodosia Paling, MD

## 2010-06-14 NOTE — H&P (Signed)
NAMEJEIDEN, DAUGHTRIDGE             ACCOUNT NO.:  [id number]   MEDICAL RECORD NO.:  [id number]          PATIENT TYPE:  EMS   LOCATION:  ED                           FACILITY:  [facility]   PHYSICIAN:  Eric L. August Saucer, M.D.     DATE OF BIRTH:  08-21-1924   DATE OF ADMISSION:  05/11/2008  DATE OF DISCHARGE:                              HISTORY & PHYSICAL   CHIEF COMPLAINT:  Progressive weakness, fever with sepsis.   HISTORY OF PRESENT ILLNESS:  This is one of several [facility] admissions for this 77 year old married black male with  longstanding history of nonischemic cardiomyopathy, progressive  dementia, diabetes mellitus, and degenerative joint disease.  The  patient had been doing moderately well at home up until approximately 1  week ago.  One of the patient's sons who had normally checked on him had  gone out of town for the past 4 days.  When the son returned and checked  on his father at his home, father was noted to be significantly weaker  and less responsive.  Other family members reported that the patient had  been lying in bed for the past 2 to 3 days.  His appetite and eating had  been variable over the past 3 to 4 days as well.  Today, the patient was  minimally responsive.  The patient was extremely dehydrated.  The son  subsequently brought the patient to the emergency room for further  evaluation.  The patient is admitted at this time with evidence for  urinary tract infection and sepsis.   PAST MEDICAL HISTORY:  Past medical history is notable for ischemic  cardiomyopathy as noted.  He has a history of cardiac arrhythmias with  pacer being placed in December of last year for atrial ventricular  block.  The patient has a history of progressive dementia secondary to  Alzheimer's disease as well.  Past history of pulmonary embolus in March  of 2008.  The patient's history is also significant for benign prostatic  hypertrophy, which has been managed with  Proscar.   The patient over the past several years has had intermittent visits to  the local rehab center, [facility], for brief stays.  He has  invariably returned back to his home situation after becoming more  mobile.  There has been ongoing concern regarding decreasing ability of  the family to manage the patient's medical care at home.   SOCIAL HISTORY:  Social history is as noted above.  The patient lives at  home with his wife.  He has two sons and one daughter.  Presently, one  daughter is living in the home but is planning to move.  The patient has  not been able to manage himself for the past few years they have noted.   PRESENT MEDICATIONS:  1. Namenda 5 mg b.i.d.  2. Aricept 10 mg daily.  3. Haldol 1 mg p.o. q.h.s. p.r.n.  4. Aspirin 81 mg daily.  5. Digoxin 0.125 mg every other day.  6. Nitroglycerin patch 0.2 mg daily.  7. Spironolactone 25 mg daily.   PHYSICAL  EXAMINATION:  GENERAL:  He is a weak-appearing black male,  minimally responsive.  The patient will follow to voice stimulation.  VITAL SIGNS:  Initially revealed a blood pressure of 133/90, pulse of  117, respiratory rate 20, and temperature 98.2.  His temperature rose to  101 rectally.  HEENT:  Head normocephalic and atraumatic.  Extraocular muscles are  grossly intact.  TM's with cerumen bilaterally.  Throat:  Membranes dry.  He has decreased gag reflex.  NECK:  No enlarged thyroid.  He has left posterior cervical node without  obvious tenderness.  Negative on the right side.  LUNGS:  Decreased breath sounds at bases.  No wheezes or rales  appreciated.  CARDIOVASCULAR:  Rapid rhythm, normal S1 and S2.  No definite S3  appreciated.  ABDOMEN:  Soft.  Bowel sounds present.  MUSCULOSKELETAL:  Exam notable for the left arm.  This is enlarged  versus the right arm.  He has blister formation on the left upper  deltoid region.  Not increased warmth.  No obvious cords palpated.  EXTREMITIES:  Negative  edema.  Negative Homans.  Trace dorsalis pedis  pulses bilaterally.  Decreased range of motion in his lower extremities  as well.  NEUROLOGIC:  The patient opens eyes to stimulation.  Does not respond  verbally.  Does not follow commands consistently.  Good passive range of  motion in upper and lower extremities.  SKIN:  As noted above.   LABORATORY DATA:  CBC reveals a WBC of 15,000, hemoglobin 16.0,  hematocrit of 47.0, 90% neutrophils, and 4% lymphs.  D-dimer elevated at  3.64.  Electrolytes:  Sodium 155, potassium 5.0, chloride 123, CO2 25,  BUN 63, and creatinine 2.3.  SGOT 40, SGPT 16, total protein 7.8, and  albumin 3.4.  Ammonia 79.  Myoglobin of greater than 500.  CPK-MB of  6.7.  Troponin less than 0.05.  Digoxin of less than 0.2.  Urinalysis:  pH of 8, specific gravity 1.023, protein greater than 300 mg percent.  The wbc's were too numerous to count, 11 to 20 rbc's, and many bacteria.  Stool was occult negative.   CT scan of the head demonstrated atrophy with chronic small-vessel  disease with no acute abnormality noted.  Chest x-ray demonstrated  cardiomegaly without active disease.  X-ray of the left shoulder  demonstrated moderate superior and inferior acromioclavicular spur  formation.  There was evidence for lateral soft tissue swelling at the  level of proximal humerus with no soft tissue gas noted.   EKG notable for sinus tachycardia and occasional PVC's.  Left anterior  fascicular block.  Evidence for a septal infarct.  Age undetermined.   IMPRESSION:  1. Severe urinary tract infections, rule out urosepsis.  2. Dehydration associated with decreased p.o. intake.  3. Altered mental status with progression.  4. History of Alzheimer's disease.  5. History of congestive heart failure secondary to nonischemic      cardiomyopathy.  6. Left upper extremity swelling, rule out deep vein thrombosis.  7. Subtherapeutic digoxin, questionable medication compliance.   PLAN:   The patient will be admitted to the ICU.  I have discussed code  status with the patient's family.  This will need to be confirmed with  daughter and wife who has the power of attorney at this time.  We will  continue intravenous fluids for hydration and intravenous Cipro b.i.d..  We will give him a supplemental dose of digoxin for his failure  consistent with rate control.  Control  fever as necessary.  We will need  to place on Lovenox for possible DVT in lieu of positive D-dimer and  elevation in swelling of the left upper extremity.  We will follow up  with  Doppler study in a.m.Marland Kitchen  Peripherally inserted central catheter line will  need to be placed for better access as well.  We will follow up on urine  cultures.  Further therapy pending response to above.  Blood cultures  have been obtained as well.  Prognosis is guarded at this time.           ______________________________  Lind Guest August Saucer, M.D.     ELD/MEDQ  D:  05/12/2008  T:  05/12/2008  Job:  [id number]

## 2010-06-14 NOTE — H&P (Signed)
[name], [name]             ACCOUNT NO.:  [id number]   MEDICAL RECORD NO.:  [id number]          PATIENT TYPE:  INP   LOCATION:  1506                         FACILITY:  [facility]   PHYSICIAN:  Eric L. August Saucer, M.D.     DATE OF BIRTH:  February 22, 1924   DATE OF ADMISSION:  08/23/2007  DATE OF DISCHARGE:                              HISTORY & PHYSICAL   CHIEF COMPLAINT:  Increasing weakness with a progressive cough.   HISTORY OF PRESENT ILLNESS:  One of several [facility]  admissions for this 77 year old married black male with a history of  progressive Alzheimer's disease, cardiac dysrhythmia, congestive heart  failure and coronary artery disease.  He also has mild diabetes  mellitus.  The patient most recently has been at home being marginally  cared for by family.  His son had noted increasing weakness in the  patient over the past couple of days.  It is unclear as to how well the  patient was obtaining his medications as well.  He was noted to have  increasing cough which was nonproductive.  The patient had denied any  chest pains.  Appetite had been variable.  There was increased concerned  regarding the patient's ability to be in a safe environment.  Today the  patient's son visited the patient and noted him to be increasingly weak  and short of breath.  The patient was subsequently brought to the  emergency room for further evaluation.   PAST MEDICAL HISTORY:  Notable as above.  The patient has a history of  nonischemic cardiomyopathy, a history of progressive dementia, diabetes  mellitus with mild renal insufficiency.  He has a previous history of  pulmonary embolus in March 2008.   ALLERGIES:  The patient has no known allergies.   MEDICATIONS:  Most recent medications per record consist of:  1. Coreg 12.5 mg b.i.d.  2. Lanoxin 0.125 mg every other day.  3. Lasix 40 mg q.a.m.  4. Namenda 5 mg b.i.d.  5. Avapro 150 mg b.i.d.  6. Allopurinol 1 mg nightly.  7. Protonix  40 mg daily.  8. Spironolactone 25 mg daily.  9. K-Dur 20 mEq daily.  10.Minitran 0.2 mg per hour patch daily.  11.Aricept 10 mg daily.  12.Proscar 5 mg daily.   PHYSICAL EXAMINATION:  GENERAL:  Presently he is a well-developed, well-  nourished black male in no acute distress.  VITAL SIGNS:  Blood pressure of 133/83, pulse 86, respiratory rate 20 at  rest.  O2 sats 98% on 2 liters.  HEENT:  Head normocephalic, atraumatic without bruits.  Extraocular  muscles are intact.  There is no sinus tenderness.  TMs without  erythematous changes with some cerumen.  Throat:  Posterior pharynx  clear.  Constant movement of the tongue noted.  NECK:  Supple.  No positive cervical nodes.  LUNGS:  Decreased breath sounds at the bases.  Scattered rhonchi noted  bilaterally.  CARDIOVASCULAR:  Normal S1-S2.  Irregular rhythm.  A 1/6 systolic  ejection murmur loudest in the lower left sternal border.  ABDOMEN:  Soft.  Bowel sounds present.  Nontender.  EXTREMITIES:  Negative Homans.  No edema.  NEUROLOGIC:  He was alert and oriented to person.  Negative place and  time.  Difficulty following basic commands.  Mildly decreased grip on  the left versus right.  SKIN:  Without active lesions.   LABORATORY DATA:  CBC reveals WBC of 4500, hemoglobin 13.1, hematocrit  of 39.5.  Platelets 191,000.   Electrolytes:  Sodium 146, potassium 3.9, chloride 109, CO2 29, BUN 18,  creatinine 1.7, glucose 107.  Digoxin is less than 0.2.  Chest x-ray  demonstrates bibasilar infiltrates versus atelectasis.   EKG demonstrates sinus rhythm with occasional PVCs and fusion complexes.  Left axis deviation.  Anterior infarct, age undetermined.   IMPRESSION:  1. Bibasilar pneumonia.  Rule out other.  2. Progressive weakness secondary to the above.  3. Alzheimer's disease progressive.  4. History of nonischemic cardiomyopathy.  5. Unsafe home environment.  Family has been unable to provide      consistent care at this  time.   PLAN:  The patient admitted this time for further treatment of his  pneumonia and stabilization of his overall medical condition.  We will  ensure adequate oral intake.  Continue his other home medications for  his dementia and previously documented heart failure.  Further therapy  pending response to the above.  Consideration for skilled nursing  facility placement.   It should be mentioned that he presently lives with his wife.  He has 3  kids, one of whom has severe congestive heart failure who had been the  primary care Emmet Messer as well.  We will follow up on this further on  this admission.           ______________________________  Lind Guest August Saucer, M.D.     ELD/MEDQ  D:  08/23/2007  T:  08/23/2007  Job:  [id number]

## 2010-06-14 NOTE — H&P (Signed)
NAMEGARRIE, WOODIN             ACCOUNT NO.:  [id number]   MEDICAL RECORD NO.:  [id number]          PATIENT TYPE:  INP   LOCATION:  3027                         FACILITY:  [facility]   PHYSICIAN:  Eric L. August Saucer, M.D.     DATE OF BIRTH:  Apr 18, 1924   DATE OF ADMISSION:  02/10/2008  DATE OF DISCHARGE:                              HISTORY & PHYSICAL   CHIEF COMPLAINT:  Progressive weakness with left-sided weakness.   HISTORY OF PRESENT ILLNESS:  One of several [facility]  admissions for this 77 year old married black male with history of  Alzheimer disease with periodic agitated confusion, history of  congestive heart failure, diabetes mellitus, and dysphasia, aggravated  by dementia.  He had been doing only moderately well up until  approximately 3 weeks ago.  Family at that time began noting increasing  left-sided weakness.  This gradually progressed to the point of him not  being able to ambulate with or without assistance.  The patient did not  note any change in his speech.  He did not note any worsening of his  already poor memory.  He presently progressed to the point that he is  unable to ambulate without assistance.  They note marked weakness of the  left upper extremity greater than left lower extremity.  He was seen in  the office today for further evaluation.  The patient has insufficient  caregivers in the home.  His wife is informed as well.  He was,  subsequently, brought in for further evaluation and with goals of  increased mobilization.   Past medical history is remarkable for long-standing nonischemic  cardiomyopathy.  He has diabetes mellitus with mild renal insufficiency.  Recent admission at the end of July for bibasilar pneumonia as well.  History is, otherwise, significant for him having a pacer placed in  December for atrioventricular block.  This was associated with  intermittent presyncopal spells.  He has not had this happened since  that time.  The  past history is, otherwise, notable for pulmonary  embolus in March 2008.   The patient has no known allergies.   Most recent medications consist of:  1. Aricept 10 mg p.o. nightly.  2. Namenda 5 mg p.o. b.i.d.  3. Aldactone 25 mg daily.  4. Benicar 20 mg daily.  5. Coreg 12.5 mg p.o. q.12 h.  6. Haldol 1 mg nightly.  7. Lanoxin 0.125 mg every other day.  8. Nitro-Dur patch 0.4 mg daily.  9. Proscar 5 mg daily.  10.Protonix 40 mg daily.   SOCIAL HISTORY:  The patient lives at home with his wife.  He has 2 sons  who have been supportive.  Recently, one older son has developed  significant congestive heart failure, end stage.  He is unable to  participate physically in the care of his father.  He has another son  and daughter.  His son has been the main care Tinslee Klare with the wife.  This has become increasingly challenging for the family at this time.  Wife has recently consulted the [place] as he is a Cytogeneticist and in an  effort  to obtain further assistance for home care.   PHYSICAL EXAMINATION:  GENERAL:  He is a slender, well-developed black  male, presently in no acute distress.  VITAL SIGNS:  Blood pressure of 130/74, pulse of 66, respiratory rate  18, temperature 97.4, O2 sat 100% on room air.  The patient unable to  cooperate for height and weight.  HEENT:  Head is normocephalic, atraumatic.  He denies any sinus  tenderness to direct palpation.  (The patient is somewhat guarded).  TMs  with decreased light reflex.  No erythematous changes.  Throat,  posterior pharynx clear.  NECK:  No enlarged thyroid.  No positive cervical nodes.  LUNGS:  Diminished breath sounds at bases.  No E-to-A changes.  No  wheezes appreciated.  CARDIOVASCULAR:  Normal S1 and S2 without S3 at this time appreciated.  ABDOMEN:  Soft.  No masses or tenderness noted.  EXTREMITIES:  Notable for the left upper extremity feeling cool to the  touch versus right.  He has 1+ radial pulses, however, bilaterally.   Trace dorsalis pedis pulses bilaterally.  Negative Homans.  No edema.  NEUROLOGIC:  The patient is alert and oriented to person only.  Negative  to place and time.  He appears somewhat agitated at times but otherwise  cooperative.  He will move his right upper and lower extremities to  command.  He will raise the left leg minimally to verbal commands.  The  patient is not able to cooperate for further examination.  SKIN:  Without active lesions.   LABORATORY DATA:  CBC reveals WBC of 4800, hemoglobin of 12.8,  hematocrit of 39.0.  Platelets of 246,000.  INR 1.2.  Other lab studies  pending.   IMPRESSION:  1. Alzheimer disease with progressive dementia.  2. New onset of left upper extremity weakness, now more chronic than      acute.  Rule out cerebrovascular accident.  3. History of nonischemic cardiomyopathy.  4. History of dysphasia after previous strokes.  5. Increasing immobility and deconditioning.  The patient is presently      unable to ambulate without assistance.  6. Questionable unsafe home environment.  The patient presently      without adequate care providers.  Family has tried previously to be      supportive; this however is becoming more challenging for multiple      reasons.  7. Rule out occult infection.  8. Mild diabetes mellitus.   PLAN:  The patient is admitted at this time for further evaluation.  We  will obtain CT scan of the head.  We will start on IV fluids.  We will  obtain PT and OT evaluation.  We will monitor the patient's vital signs  while he is on blood pressure medication, which have been reduced at  this time.  He will need speech therapy evaluation again as well.  Should the patient not respond to rehabilitative efforts, alternative  living situation may need to be considered.           ______________________________  Lind Guest August Saucer, M.D.     ELD/MEDQ  D:  02/10/2008  T:  02/11/2008  Job:  [id number]

## 2010-06-14 NOTE — Discharge Summary (Signed)
NAMECULLEY, HEDEEN             ACCOUNT NO.:  [id number]   MEDICAL RECORD NO.:  [id number]          PATIENT TYPE:  INP   LOCATION:  4704                         FACILITY:  [facility]   PHYSICIAN:  Eric L. August Saucer, M.D.     DATE OF BIRTH:  12-Mar-1924   DATE OF ADMISSION:  01/02/2007  DATE OF DISCHARGE:  01/17/2007                               DISCHARGE SUMMARY   FINAL DIAGNOSIS:  1. Cardiac dysrhythmia, 427.89.  2. Chronic systolic heart failure, 428.22.  3. Atrial ventricular block, first degree, 426.11.  4. Left bundle branch block, 426.2.  5. Hyperkalemia, 276.7.  6. Congestive heart failure, 428.0.  7. 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  8. Primary cardiomyopathy, 425.4.  9. Abnormality of gait, 781.2.  10.Coronary atherosclerosis of native coronary vessel, 414.01.  11.Diabetes mellitus type 2, 250.00.   OPERATION PROCEDURES:  Insertion of dual chamber pacer per Dr. Gilman Schmidt.   HISTORY OF PRESENT ILLNESS:  This was one of several [facility]  admissions for this 77 year old married black male with a longstanding  history of congestive heart failure, Alzheimer's disease and previous  cardiac arrhythmia, who was doing well until 1 day prior to admission.  The patient experienced a sense of urgency and was escorted to the  bathroom by his son.  He was noted thereafter to have fecal  incontinence.  When the son rechecked the patient he was noted to be  unresponsive on the toilet.  The patient was diaphoretic at the time.  The patient was approximately unresponsive for 30 minutes by his son's  estimates.  Upon arrival by EMS, the patient was noted to be bradycardic  with a heart rate of approximately 44 with a first-degree block as well.  The patient was subsequently brought to [facility] Emergency Room.  The  patient became more alert.  He remained at baseline confusion.  Heart  rate since that time rose to the 60's.  Because of syncopal episode with  bradycardia, the  patient was admitted for further evaluation.   Past medical history and physical exam as per admission H&P.   HOSPITAL COURSE:  The patient was admitted for further evaluation of  syncopal episodes with associated bradycardia.  He was noted to have  evidence for congestive heart failure as well.  Mild leukocytosis with  some renal insufficiency as well.  The patient was placed on telemetry  for close monitoring.  Cardiac enzymes were obtained, which was negative  for signs of recent injury.  The etiology of his bradycardia was sought.  His cardiotropic medications were adjusted initially.  It was noted that  he had this to occur in the face of a large bowel movement.  It was  suspected that this was secondary to a vasovagal reaction.   Notably, under observation, after the patient's initial presentation, he  had no further significant syncope.  He was noted to be prerenal.  He  did undergo gentle rehydration.  Renal ultrasound showed no evidence for  obstruction of his kidneys.  With hydration, this did gradually improve.  The patient was continued on monitoring.  He  notably showed no further  bradycardia.  He was seen in consultation by Dr. Graciela Husbands.  It was felt  that in the face of his not having any further significant bradycardia  and no syncopal events that further conservative management would be  warranted.   The patient thereafter was seen by physical therapy and was gradually  ambulated.  Notably, he had not had a significant bowel movement since  his admission thereafter.  This was done under observation and he had no  further problems.  By January 14, 2007, the patient was doing well with  plans to send the patient home the subsequent day.  However, on January 15, 2007, at approximately 2:00 a.m. in the morning the patient had a  severe episode of bradycardia while at rest with a heart rate between 25  and 37.  The patient remained responsive but extremely lethargic  during  that time.   The EPS team was re-consulted at that time.  It was felt that this was  consistent with a sick sinus syndrome.  In retrospect this may have been  the cause of the patient's syncopal episode at home.  It is notable that  this occurred in the setting of his discontinuation of digoxin as well  as reduction of his Coreg.  The patient was subsequently recommended to  undergo a dual chamber pacer.  This was discussed with the family, with  approval.  This was subsequently inserted on January 15, 2007.  The  patient tolerated the procedure well.  The subsequent day the patient  was made ambulatory.  Recheck of the pacer showed good capture.  The  wound site remained intact despite some manipulation by the patient  during times of confusion.  The patient was monitored an additional day.  His rhythm remained stable.  Renal functions continued to improve.  He  had no further syncopal events.  By January 17, 2007, he was felt to be  stable for discharge.  It was noted that at the time of discharge he had  normal BNP.  BUN of 23, creatinine 1.91.  Blood pressure was stable.  The patient was advised to have cardiac rehab as an outpatient.  This  was arranged with [facility].   DISCHARGE MEDICATIONS:  1. Avapro 150 mg b.i.d.  2. Protonix 40 mg once daily.  3. Potassium chloride 20 mEq once daily.  4. Minitran 0.2 mg per hour patch daily.  5. Aricept 10 mg once daily.  6. Coreg 12.5 mg once daily.  7. Haldol 1 mg half tablet at 7:00 p.m. daily as needed.  8. Lasix 20 mg half tablet every other day.  9. Colace 100 mg b.i.d..   DISCHARGE INSTRUCTIONS:  He will be maintained on a low-sodium, no-  concentrated-sweets diet.  He is to be seen in the office in two weeks'  time.  Followup in the [facility] with [facility] on Wednesday  February 06, 2007.  He is to see Dr. Ladona Ridgel on April 17, 2007.  [facility] will follow the patient up for rehabilitation if  noted.           ______________________________  Lind Guest August Saucer, M.D.     ELD/MEDQ  D:  02/20/2007  T:  02/21/2007  Job:  [id number]

## 2010-06-14 NOTE — Op Note (Signed)
NAMEBARNARD, SHARPS             ACCOUNT NO.:  [id number]   MEDICAL RECORD NO.:  [id number]          PATIENT TYPE:  INP   LOCATION:  4704                         FACILITY:  [facility]   PHYSICIAN:  Doylene Canning. Ladona Ridgel, MD    DATE OF BIRTH:  09-23-1924   DATE OF PROCEDURE:  01/15/2007  DATE OF DISCHARGE:                               OPERATIVE REPORT   PROCEDURE PERFORMED:  Implantation of a dual-chamber pacemaker.   INDICATIONS:  Symptomatic bradycardia.   INTRODUCTION:  The patient is a 77 year old man admitted to hospital  with dizziness and bradycardia and a history of syncope.  He was found  in the hospital to have severe bradycardia and is now referred for  permanent pacemaker insertion.   PROCEDURE:  After informed consent was obtained, the patient is taken  diagnostic EP lab in fasting state.  After usual preparation and  draping, intravenous fentanyl and Midazolam was given for sedation.  30  mL lidocaine was infiltrated the left infraclavicular region.  5 cm  incision was carried out over this region.  Electrocautery utilized to  dissect down to the fascial plane.  10 mL of contrast injected into the  left upper extremity venous system after attempts to cannulate the  subclavian vein were unsuccessful.  The subclavian vein was patent and  was sharply cannulated and a Guidant model 4136 active fixation pacing  lead, serial number [id number] was advanced to the right atrium and the  Guidant model 4137, serial number [id number] was advanced into the right  ventricle.  Mapping was carried out in the right ventricle.  At the  final site the R-waves were 9. The pacing impedance was 798 ohms and  threshold 0.7 volts, 0.5 milliseconds.  There is a large injury current  on the V lead when it was actively fixed.  With the ventricular lead in  satisfactory position, attention then turned placement atrial lead which  was placed in the anterolateral wall of the right atrium where P-waves  were 2  mV and the pacing impedance 521 ohms once the lead was actively  fixed.  Threshold 0.4 volts at 0.5 milliseconds.  10 volts pacing did  not stimulate the diaphragm.  With the atrial and ventricular leads in  satisfactory position they were secured to the subpectoralis fascia with  figure-of-eight silk suture.  The sewing sleeve was also secured with  silk suture.  Electrocautery utilized to make subcutaneous pocket.  Electrocautery was utilized to assure hemostasis.  The Guidant Insignia  1+ DR dual-chamber pacemaker, serial number [id number] was connected to the  atrial and ventricular leads and placed back in the subcutaneous pocket.  Generator secured with silk suture.  Additional kanamycin was utilized  to irrigate the pocket.  The incision closed with a layer of 2-0 Vicryl  followed by layer of 3-0 Vicryl.  Benzoin painted on the skin, Steri-  Strips were applied and pressure dressing placed.  The patient returned  his room in satisfactory condition.   COMPLICATIONS:  There were no immediate procedure complications.   RESULTS:  This demonstrates successful implantation of a Guidant dual-  chamber  pacemaker in a patient with symptomatic bradycardia.      Doylene Canning. Ladona Ridgel, MD  Electronically Signed     GWT/MEDQ  D:  01/15/2007  T:  01/16/2007  Job:  [id number]   cc:   Minerva Areola L. August Saucer, M.D.

## 2010-06-14 NOTE — H&P (Signed)
[name], [name]             ACCOUNT NO.:  [id number]   MEDICAL RECORD NO.:  [id number]          PATIENT TYPE:  INP   LOCATION:  0108                         FAC[facility]   PHYSICIAN:  Eric L. August Saucer, M.D.     DATE OF BIRTH:  12-20-1924   DATE OF ADMISSION:  01/02/2007  DATE OF DISCHARGE:                              HISTORY & PHYSICAL   CHIEF COMPLAINT:  Syncope with some bradycardia.   HISTORY OF PRESENT ILLNESS:  This is one of several [name] admissions for this 78 year old married black male with  longstanding history of congestive heart failure, Alzheimer's disease,  and previous cardiac arrhythmia who was doing well until last night.  The patient experienced a sense of urgency and was escorted to the  bathroom by his son.  He was noted to have some fecal incontinence at  that time.  When the son rechecked the patient, he was noted to be  unresponsive on the toilet.  He was diaphoretic at that time.  Son  estimates that he was unresponsive for approximately 30 minutes.  Upon  arrival by EMS, the patient was noted be bradycardic with a heart rate  of approximately 44 with a first-degree block as well.  The patient was  subsequently brought to the [facility] Emergency Room.  The patient  became more alert.  He remained at his baseline confusion.  Heart rate  since that time has risen to the 60s.   PAST MEDICAL HISTORY NOTABLE FOR:  Multiple admissions in the past.  1. He has known her congestive heart failure.  2. Dementia.  3. Diabetes with mild renal sufficiency.  4. He has been seen, in the past, by cardiology as well as EPS for      consideration of ICD.  At that time it was felt that his overall      functional status did not warrant the ICD placement.   Past medical history otherwise notable for admission in March 2008 for  pulmonary embolus after an extensive stay at a nursing home rehab  facility.  The patient had most recently been overall  more functional,  ambulating with assistance of his son and independently.  Appetite had  been good and he has been tolerating his medications.  He was last seen  in the office approximately 2 weeks ago doing relatively well.  Son  notes that he has not missed any of his medications except for  spironolactone over the past 2 days.  He has otherwise taken this  medication on scheduled.   The patient, himself denies any chest pains, shortness of breath,  headaches.  He remains pleasantly confused.   PRESENT MEDICATIONS CONSIST OF:  1. Coreg 12.5 mg b.i.d.  2. Lanoxin 0.125 mg daily.  3. Lasix 40 mg daily.  4. Namenda 5 mg b.i.d.  5. Avapro 150 mg b.i.d.  6. Haldol 1 mg nightly.  7. Protonix 40 mg daily.  8. Spirolactone 25 mg daily.  9. K-Dur 20 mEq daily.  10.Nitroglycerin 0.2 mg an hour daily.  11.Aricept 2 mg daily.   PHYSICAL  EXAM:  GENERAL:  In the emergency room, he is a well-developed,  well-nourished, black male confused; though in no acute distress.  VITAL SIGNS:  Initially upon arrival revealed a blood pressure 155/70,  pulse of 59, respiratory rate 20, O2 saturations of 97%.  Presently his  blood pressure is 127/51, a pulse of 57, temperature 98.0 saturations  are 98% on room air.  HEENT: Head normocephalic, atraumatic.  There is no sinus tenderness.  Extraocular muscles are intact.  No cranial bruits.  No TMs with  decreased light reflex.  NECK:  No positive cervical nodes.  No carotid bruits.  Membranes  slightly dry.  No enlarged thyroid.  LUNGS:  Clear to auscultation.  No wheezes or rales appreciated.  CARDIOVASCULAR EXAM:  Presently a normal S1-S2 without S3.  Rhythm is  regular at this time.  ABDOMEN:  Bowel sounds are present.  There are no masses or tenderness  appreciated.  EXTREMITIES:  Full range of motion.  Negative Homan's.  No edema  appreciated.  NEUROLOGICALLY:  He is alert, oriented to person, knew he was in the  hospital, though not which  hospital.  Negative to time.  Moves all  extremities.  Strength is otherwise intact.  SKIN:  Without lesions.   LABORATORY DATA:  A CBC revealed a WBC of 11,600, hemoglobin 11.7,  hematocrit of 35.6, 82% neutrophils, 10% lymphs.  Chemistry:  Sodium  138, potassium 4.4, chloride 104, CO2 26, BUN 27, creatinine 1.88,  glucose 168, calcium 9.1, total protein 6.5, albumin of 3.6, Digoxin  0.8, magnesium 2.3 myoglobin 127, CK-MB 1.6 with a troponin less than  0.05.  Urinalysis pH of 5, specific gravity 1.024, negative for  leukocytes.  Rhythm strip in the emergency room demonstrated sinus a  bradycardia at a rate of 52, approximately, with a first degree block.  A complete EKG pending.   IMPRESSION:  1. Syncopal spell with associated bradycardia.  The patient will need      to be evaluated for as possible pacer.  2. Congestive heart failure, presently compensated.  3. History of coronary artery disease.  4. Alzheimer's disease.  5. Mild diabetes mellitus.   PLAN:  The patient is admitted, at this time, for further evaluation.  He would need an EPS evaluation further for consideration of a pacer  versus other.  He is on several medications which could possibly,  compromise his rhythm status; however, these have not been shown to be  toxic at this time.  Will continue his other medications as before.  Further evaluation pending cardiac input.  Follow up thereafter.           ______________________________  Lind Guest. August Saucer, M.D.     ELD/MEDQ  D:  01/03/2007  T:  01/03/2007  Job:  [id number]

## 2010-06-14 NOTE — Discharge Summary (Signed)
NAMEZYDEN, SUMAN             ACCOUNT NO.:  [id number]   MEDICAL RECORD NO.:  [id number]          PATIENT TYPE:  INP   LOCATION:  1312                         FACILITY:  [facility]   PHYSICIAN:  Michelene Gardener, MD    DATE OF BIRTH:  04/12/1924   DATE OF ADMISSION:  07/03/2008  DATE OF DISCHARGE:  07/08/2008                               DISCHARGE SUMMARY   DISCHARGE DIAGNOSES:  1. Altered mental status secondary to pneumonia.  2. Aspiration pneumonia.  3. Dehydration.  4. Failure to thrive.  5. Nonischemic cardiomyopathy.  6. History of stroke.  7. Dementia.  8. History of sacral decubitus ulcer.  9. History of diabetes.  10.History of prostate cancer.   DISCHARGE MEDICATIONS:  1. Augmentin 875 mg p.o. q.12 h for 1 week.  2. Digoxin 0.125 mg once a day.  3. Aspirin 81 mg once a day.  4. Multivitamin 1 tablet once a day.  5. Vitamin C 500 mg twice daily.  6. Zinc 220 mg once a day.  7. Lopressor 12.5 mg twice daily.  8. Nitroglycerin patch 0.2 mg per hour to be applied at 8 in the      morning and removed at 8 p.m.  9. Lactulose 30 mg once a day as needed.  10.Prilosec 20 mg at bedtime.  11.Tylenol 650 mg every 4 hours as needed.   CONSULTATIONS:  None.   PROCEDURES:  EEG on June 8 showed generalized slowing which is  nonspecific and might be related to dementia.   DIAGNOSTIC STUDIES:  Chest x-ray on June 4 showed left upper lobe  bronchopneumonia and aspiration pneumonitis suspected.  Follow up with  primary doctor within a week.   HOSPITAL COURSE:  This is an 77 year old African American male who was  sent to the hospital with altered mental status.  Workup showed  pneumonia which was suspected for aspiration.  The patient was admitted  to the hospital.  He was started on IV antibiotics.  Oxygen was started  to keep his saturation above 90%.  The patient improved very well during  this hospitalization so his vancomycin and Zosyn were discontinued.  His  antibiotics were switched to Augmentin.  During this hospitalization he  had a CAT scan of his head that came to be negative and his altered  mental status was attributed to his underlying pneumonia.  EEG was done  and again came to be negative.  The patient was given IV fluids and his  dehydration was resolved.   Currently at the time of discharge the patient is almost back to his  baseline.  There are no new findings.  His vitals have been stable.  There is no fever.  Workup with CAT scan and EEG has been negative.  He  had consultation during this hospitalization with nutritional consult,  social worker consult.  He will be discharged on all preadmission  medications in addition to Augmentin as mentioned above.   Total assessment time is 40 minutes.      Michelene Gardener, MD  Electronically Signed     NAE/MEDQ  D:  07/08/2008  T:  07/08/2008  Job:  [id number]

## 2010-06-14 NOTE — Discharge Summary (Signed)
[name], [name]             ACCOUNT NO.:  [id number]   MEDICAL RECORD NO.:  [id number]          PATIENT TYPE:  INP   LOCATION:                               FACILITY:  [facility]   PHYSICIAN:  Eric L. August Saucer, M.D.     DATE OF BIRTH:  May 20, 1924   DATE OF ADMISSION:  05/12/2008  DATE OF DISCHARGE:  05/22/2008                               DISCHARGE SUMMARY   FINAL DIAGNOSES:  1. Proteus urinary tract infection.  2. Acute febrile illness, rule out sepsis.  Blood cultures negative.  3. Severe dehydration, resolved.  4. Renal insufficiency, improved.  5. Rhabdomyolysis, resolved.  6. Dementia secondary to Alzheimer disease and acute metabolic      encephalopathy, improved.  7. Nonischemic cardiomyopathy.  8. Nonsustained ventricular tachycardia, episodic.  9. Malnutrition.  10.Sacral decubitus ulcers.  11.Insulin resistance, stable.   OPERATIONS AND PROCEDURES:  CT scan of the head.   HISTORY OF PRESENT ILLNESS:  This was one of several [facility] admissions for this 77 year old married black male with a  longstanding history of nonischemic cardiomyopathy, progressive  dementia, diabetes mellitus and degenerative joint disease.  The patient  had been doing moderately well at home until 1 week prior to admission.  When the patient's son who had been out of town came by the house to  check on him, he was noted to be severely weak and in a bedridden state,  and the son had last seen the patient 4 days prior to that.  Other  family members have reported that the patient had been lying in bed for  the past 2-3 days.  His appetite notably had become extremely poor.  The  patient had become more confused and weak as well.  The patient was  subsequently brought to the emergency room for evaluation by his son.  The patient was noted to have a severe urinary tract infection with  fever with possible sepsis and was subsequently admitted thereafter.   PAST MEDICAL HISTORY AND  PHYSICAL EXAMINATION:  As per admission H and  P.   HOSPITAL COURSE:  The patient was admitted for further evaluation of his  severe urinary tract infection.  He was noted to be markedly dehydrated  with a serum sodium of 155.  He was notably confused as well.  A CT scan  of the head was done at the time of admission with no acute abnormality  being seen.  The patient was admitted to an intensive care unit.  He was  started on IV fluids with normal saline.  Blood cultures were obtained  as well as urine cultures.  The patient was placed on IV Cipro as well.  He was also placed on Lovenox for DVT prophylaxis as he was noted to  have significant swelling of the left upper extremity as well.  A  subsequent Doppler study of this area, however, proved to be negative  for DVT.   The patient was also noted at the time of admission to have elevated CPK  as well.  Troponin levels were initially negative.  This was  felt to be  secondary to a rhabdomyolysis.  This was followed closely.  Over the  subsequent 24 hours, he did have a mild rise of his troponin and  myoglobin value.  This, however, subsided.   Over the subsequent days the patient was noted to have episodic  ventricular tachycardia.  He was subsequently placed on IV Lopressor.  He was also placed on low-dose nitroglycerin patch.  This did improve  his symptoms considerably.  The patient's urine culture returned  positive for Proteus species.  This was noted to be sensitive to Cipro  and therefore this was continued.  His blood cultures did not return  positive for infectious organisms.   Over the subsequent days, the patient's mental status fluctuated  greatly.  This, however, did gradually improve.  He was subsequently  transferred to telemetry once his p.o. intake increased.  At the time  his blood pressure was stable without significant arrhythmias except for  intermittent V-tach nonsustained.   The patient continued to improve  on the floor.  He had fluctuating  mental status with gradual increasing p.o. intake.  After 7 days of IV  Cipro the patient was switched to p.o. Cipro.  He remained afebrile at  that time.  His oral intake did gradually improve.  His mental status  continued to fluctuate, though he would have periods of alertness and  respond appropriately.  On serial exams he was noted to have early  sacral decubiti.  Wound care was addressed, with appropriate measures  being taken.   By May 20, 2008 the patient was feeling considerably better.  Efforts  thereafter were directed toward appropriate long-term care for the  patient.  It is felt that the patient would not have a safe home  environment should he return to his previous living situation.  Efforts  are presently being directed toward the patient being transferred to a  skilled nursing facility.  Rehab efforts may be attempted, but it is  felt that the patient would need long-term care.   The patient is presently comfortable, eating, no voiced complaints.  Rehab potential remains questionable, but efforts will be directed  toward this as well.   Medications at time of discharge consist of:  1. Nitroglycerin patch 0.2 mg per hour daily.  2. Lopressor 12.5 mg q.12 h.  3. Chronulac 30 mL p.o. daily.  4. Protonix 40 mg p.o. b.i.d.  5. Lanoxin 0.125 mg p.o. daily.  6. Ensure 1 can t.i.d. in between meals.  7. Tylenol 325-650 mg p.o. q. 4 h p.r.n. pain.  8. Haldol 1 mg p.o. bedtime p.r.n. confusion.  9. He is to use Thick-It with his meals and liquids.  10.He is to have Mepilex applied to buttocks and changed q. 5 days or      p.r.n.Marland Kitchen  11.He will have Flo heel protectors on at all times.   The patient will be maintained on a dysphagia 1 diet, advance as  tolerated.  He will have Thick-It use as appropriate.   Overall prognosis is poor.  The patient is a no code as noted in the  hospital records.  The patient is as of now ready to be  transferred to  [facility] for further care.           ______________________________  Lind Guest. August Saucer, M.D.     ELD/MEDQ  D:  05/22/2008  T:  05/22/2008  Job:  [id number]

## 2010-06-14 NOTE — Discharge Summary (Signed)
[name], [name]             ACCOUNT NO.:  [id number]   MEDICAL RECORD NO.:  [id number]          PATIENT TYPE:  INP   LOCATION:  1506                         FACILITY:  [facility]   PHYSICIAN:  Eric L. August Saucer, M.D.     DATE OF BIRTH:  05-02-1924   DATE OF ADMISSION:  08/23/2007  DATE OF DISCHARGE:  08/30/2007                               DISCHARGE SUMMARY   FINAL DIAGNOSES:  1. Bibasilar pneumonia, resolved.  2. Acute on chronic congestive heart failure, compensated.  3. Alzheimer disease with periodic agitated confusion.  4. Diabetes mellitus.  5. Dysphagia aggravated by dementia.   OPERATIONS AND PROCEDURES:  None.   HISTORY OF PRESENT ILLNESS:  This was one of several [facility] admissions for this 77 year old married black male with history  of progressive Alzheimer disease, cardiac dysrhythmia, congestive heart  failure and coronary artery disease.  He has a history of mild diabetes  mellitus.  The patient had been at home unfortunately being marginally  cared for by family.  His son who had been the primary care [name] had  noted increasing weakness in the patient over the past several days  prior to admission.  It was unclear as to how the patient was obtaining  his medication as well.  He was noted to have increasing cough which was  nonproductive.  The patient denied any chest pains.  His appetite had  been variable.  Because of progressive weakness and concern for the  patient's overall care, the patient was brought to the hospital for  further evaluation.  He was noted to have a pneumonia and was admitted  for further evaluation.   PAST MEDICAL HISTORY AND PHYSICAL EXAM:  Per admission H&P.   HOSPITAL COURSE:  The patient was admitted for further treatment of  progressive weakness and confusion.  X-rays did demonstrate evidence for  bibasilar infiltrates suggestive of pneumonia.  He was noted to have  secondary progressive weakness as well.  He was  started on IV  antibiotics consisting of Rocephin.  He was gradually restarted on his  cardiac medications as well.  He was also placed on incentive spirometry  to maximize his pulmonary status.   Over the subsequent days the patient gradually improved.  He became more  alert.  Appetite improved as well.  Serial BNPs were followed.  It was  noted to be elevated consistent with his known acute on chronic  congestive failure.  The patient's cardiac medications were maximized  during this hospital stay which he tolerated well.  The patient also was  maintained on a no concentrated sweets diet.  Blood sugars remained less  than 150.   The patient was noted to have some difficulty intermittently with  chewing.  A speech evaluation was obtained again.  It was felt that the  patient had some cognitive impairment which limited his eating at times.  He was placed back on a dysphagia 3 diet which he tolerated well.   Serial BNPs showed a gradual decline consistent with some improvement.  By August 29, 2007 his BNP was 608 and still decreasing.  The patient at  rest, however, had no shortness of breath.  O2 sats were greater than  95%.  His vital signs were otherwise stable.   Efforts at disposition were reviewed.  In view of the patient's multiple  medical concerns as well as limited family support for intensive care,  it was decided that the patient would need to be in a 24-hour skilled  nursing facility.  He was evaluated by OT/PT, with this being their  recommendation as well.  This was discussed with the family.  It was  finally agreed that the patient would be most appropriately managed at a  skilled nursing facility.   The patient's IV antibiotics were discontinued.  He was monitored for 24  hours with no associated fever.  Chest x-ray showed mainly basilar  atelectasis without acute infiltrates.  The patient is sitting up in  chair.  He is pleasant though and confused.   It should be  noted that the patient had periodically had mild agitation,  especially at night, with confusion.  He did require occasional Ativan  with good relief.  He will be otherwise maintained on a chronic Haldol  dose at night.   At this time the patient is stable to be transferred to [facility].   MEDICATIONS AT THE TIME OF DISCHARGE CONSIST OF:  1. Aldactone 25 mg daily.  2. Aricept 10 mg daily.  3. Benicar 20 mg daily.  4. Coreg 12.5 mg p.o. q.12 h.  5. Haldol 1 mg q.h.s.  6. K-Dur 20 mEq daily.  7. Lanoxin 0.125 mg every other day.  8. Namenda 5 mg p.o. b.i.d.  9. Nitro-Dur patch 0.4 mg per hour daily.  10.Proscar 5 mg daily.  11.Protonix 40 mg daily.   He will be maintained on a 4-g sodium modified carbohydrate dysphagia 3  diet.  Exercise as tolerated.           ______________________________  Lind Guest. August Saucer, M.D.     ELD/MEDQ  D:  08/30/2007  T:  08/30/2007  Job:  [id number]

## 2010-06-14 NOTE — Discharge Summary (Signed)
[name], [name]             ACCOUNT NO.:  [id number]   MEDICAL RECORD NO.:  [id number]          PATIENT TYPE:  INP   LOCATION:  3027                         FACILITY:  [facility]   PHYSICIAN:  Eric L. August Saucer, M.D.     DATE OF BIRTH:  07-06-1924   DATE OF ADMISSION:  02/10/2008  DATE OF DISCHARGE:  02/19/2008                               DISCHARGE SUMMARY   FINAL DIAGNOSES:  1. Nonischemic cardiomyopathy.  2. Urinary tract infection.  3. Dementia secondary to Alzheimer's.  4. Asymptomatic left carotid artery stenosis.  5. Diabetes mellitus.  6. Cervical degenerative joint disease.  7. Progressive weakness, multifactorial, improved.   OPERATIONS AND PROCEDURES:  1. CT scan of the head.  2. Carotid artery Dopplers.   HISTORY OF PRESENT ILLNESS:  This is one of several [facility]  admissions for this 77 year old married black male with history of  Alzheimer disease with periodic agitated confusion, history of  congestive heart failure, diabetes mellitus, and dysphagia with  progressive dementia.  The patient had been doing only moderately well  up until approximately 3 weeks ago.  Family at that time began noting  increasing left-sided weakness.  This gradually progressed to the point  of him not been able to ambulate without assistance or with assistance.  The family did not note any change in his speech or any worsening of his  already poor memory.  The patient gradually progressed to the point that  he was unable to ambulate without assistance.  Family had noted marked  weakness of the left upper extremity greater than the left lower  extremity.  The patient was seen in the office on February 10, 2008 and  was subsequently admitted for further evaluation.   Past medical history and physical exam is per admission H and P.   HOSPITAL COURSE:  The patient was admitted for further evaluation of  progressive weakness with notable left upper extremity weakness.  He had  increasing difficulty with ambulation as well.  His oral intake had been  decreased.  A CT scan of his head did not show any signs of acute  changes.  The patient was noted by urinalysis to have suggestion of a  urinary tract infection.  He was subsequently started on Rocephin 1 gram  IV as well.  The patient was seen in consultation by Physical Therapy as  well as Speech Therapy.  He had had a previous history of dysphagia.  It  was felt, however, that the patient did well with dysphagia III  precautions.  His appetite did improve over the subsequent days.   With the antibiotic therapy as well as gentle rehydration, the patient's  mental status did improve.  He was maintained on his Namenda and Aricept  during his hospital stay.  He had family members in attendance  constantly during his hospital stay which helped his orientation.  The  patient notably also had a x-ray done of the cervical spine which showed  extensive degenerative changes as well.  Further evaluation of his left  upper extremity weakness consisted of a carotid Doppler study.  This  showed evidence for a left internal carotid artery stenosis.  He was  seen in consultation by Vascular Surgery.  It was felt that no further  intervention will be warranted given his overall status.  It was felt  that he was relatively asymptomatic from this at this time.   The patient thereafter was gradually ambulated.  It was noted that he  had significant degenerative changes in the left knee which impaired his  ability to ambulate.  X-ray of this did confirm tricompartmental  degenerative changes.  He was subsequently given a steroid injection per  Interventional Radiology, which he tolerated well.   During his hospital stay, there was also evidence for constipation.  He  did require disimpaction which was successful.   Due to his multiple medical problems and limited home health, they felt  that the patient would still be a good  candidate for rehab at a local  facility.  The patient's wife is unable to manage him at this time.  Arrangements have subsequently been made for the patient being  transferred to [facility] for further treatment.  It is  recognized that he has multiple medical problems which are fairly stable  at this time.   DISCHARGE MEDICATIONS:  1. Aricept 10 mg p.o. at bedtime.  2. Haldol 1 mg p.o. at bedtime p.r.n.  3. Namenda 5 mg b.i.d.  4. Aspirin 81 mg daily.  5. Digoxin 0.125 mg every other day.  6. Nitroglycerin patch 0.2 mg per hour daily.  7. MiraLax 17 grams daily for constipation.  8. Colace 100 mg p.o. t.i.d.  9. Robitussin with dextromethorphan 10 mL p.o. t.i.d. p.r.n. cough.   He will be maintained on Ceftin 250 mg b.i.d. for 3 additional days.  The patient will be maintained on a 4 grams sodium dysphagia III diet.   PLAN:  Present plans are for the patient to eventually return to his  home situation if this is determined to be safe.           ______________________________  Lind Guest. August Saucer, M.D.     ELD/MEDQ  D:  02/19/2008  T:  02/19/2008  Job:  [id number]

## 2010-06-17 NOTE — Consult Note (Signed)
NAMEHUMPHREY, GUERREIRO             ACCOUNT NO.:  [id number]   MEDICAL RECORD NO.:  [id number]          PATIENT TYPE:  INP   LOCATION:  3305                         FACILITY:  [facility]   PHYSICIAN:  Duke Salvia, M.D.  DATE OF BIRTH:  1924-08-20   DATE OF CONSULTATION:  03/24/2004  DATE OF DISCHARGE:                                   CONSULTATION   REFERRING PHYSICIAN:  Mohan N. Sharyn Lull, M.D.   REASON FOR CONSULTATION:  Mr. Thielen is a 78 year old male with no previous  history of coronary artery disease, who was recently admitted for evaluation  of mental status changes in the setting of low-grade fever, was found to  have abnormal troponin markers, and subsequently referred for cardiac  catheterization.  The patient underwent coronary angiography earlier today  by Dr. Sharyn Lull, revealing essentially normal coronary arteries with severe  global hypokinesis (EF 20 to 25%).  The patient is now referred for  consideration of ICD implantation.   The patient is unable to provide an accurate history given that he has been  diagnosed with Alzheimer's dementia.  However, his daughter who is present  with him, was able to provide the essential history.  Per her report, there  has been no recent report of signs or symptoms suggestive of exertional  dyspnea, paroxysmal nocturnal dyspnea, or lower extremity edema.  However,  the patient does seem to have a chronic recurring cough, and does wake up  often in the night.  He also has been sleeping on two pillows for a  questionable time.  She also reports that he does not exert himself very  much anymore, whereas he used to walk quite often a year or so ago.  However, he reports no angina pectoris, nor any exertional dyspnea.  There  is also no report of tachy palpitations.   ALLERGIES:  No known drug allergies.   MEDICATIONS:  1.  Aricept 10 mg daily.  2.  Atenolol 15 mg daily.  3.  Travatan eye drops o.u. q.h.s.   PAST MEDICAL HISTORY:  1.  History of syncope in July 2002, evaluated with echocardiogram revealing      mild/moderate inferior hypokinesis with mild aortic regurgitation/mitral      regurgitation.  A MRI of the brain revealed small vessel disease with no      acute changes.  A Cardiolite at that time revealed no evidence of      scar/ischemia;  calculated ejection fraction 39% with inferior      septal/lateral apical hypokinesis.  2.  History of hypertension.  3.  Hypercholesterolemia.  4.  Ulcerative esophagitis by EGD on March 01, 2004.  5.  Alzheimer's dementia.  6.  History of BPH with elevated PSA.   SOCIAL HISTORY:  The patient lives here in [place] with his wife.  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  Has never smoked tobacco  on a regular basis.  Denies alcohol use.   FAMILY HISTORY:  Negative for coronary artery disease.  However, history of  sudden death in young 68 male and 75 year old male.  REVIEW OF SYSTEMS:  The patient's daughter reports no known history of  myocardial infarction, congestive heart failure, or stroke.  He has had no  recent episodes of syncope.  No recent evidence of upper or lower GI  bleeding.  Otherwise, as noted per HPI.  Remaining systems negative.   PHYSICAL EXAMINATION:  VITAL SIGNS:  Blood pressure 122/66, pulse 78 and  regular, respirations 21, temperature 98.6, saturation 99% on room air.  GENERAL:  A 77 year old male in no acute distress.  HEENT:  Normocephalic, atraumatic.  NECK:  Preserved bilateral carotid pulses without bruits, no JVD.  LUNGS:  Bibasilar rhonchi, but without crackles or wheezes.  HEART:  Regular rate and rhythm (S1 and S2), no murmurs, rubs, or gallops.  ABDOMEN:  Soft, nontender, intact bowel sounds, no bruits.  EXTREMITIES:  Preserved bilateral femoral and posterior tibialis pulses;  intact distal pulses with no significant pedal edema.  NEUROLOGIC:  No focal deficit.   LABORATORY DATA:  Chest x-ray (March 22, 2004):  Stable cardiomegaly;  left lower lobe atelectasis/scarring;  no new findings.   A  head CT scan:  Mild diffuse atrophy;  old right basal ganglia lacunar  infarct;  bilateral ischemic white matter changes;  no acute disease.   Electrocardiogram (March 21, 2004):  Normal sinus rhythm at 93 beats per  minute with left axis deviation;  inverted T-waves in leads V4 through V6.  QTC 0.405, which increased to 0.505 by electrocardiogram today.   Telemetry review reveals occasional PVC's with runs of NSVT (four to six  beats).   Laboratory data:  Hemoglobin 10.9 (13 on admission), MCV 83, hematocrit 32,  WBC 3, platelets 193.  Sodium 137, potassium 3.6, BUN 15, creatinine 1.3  (22/1.8, respectively, admission), glucose 121.  BNP 262.  Cardiac enzymes:  Elevated total CPK with a peak at 289, but normal MB's;  elevated troponin-I  with peak of 0.21.  Iron profile:  Decreased iron level of 12, decreased  TIBC of 177, decreased percentage of 7, normal ferritin at 282.  Urinalysis  notable for presence of blood, protein, ketones, and trace leukocyte  esterase with negative nitrite.  Both blood and urine cultures have been  negative.   IMPRESSION:  1.  Non-ischemic cardiomyopathy.      1.  Unknown duration.  2.  Congestive heart failure (NYHA class I).  3.  Narrow QRS.  4.  QT prolongation.      1.  Question secondary to Avelox.  5.  Remote syncope (details unknown).  6.  Non-sustained ventricular tachycardia.  7.  Dementia (oscillating).  8.  Iron-deficiency anemia.      1.  Question gastrointestinal blood loss.  9.  Hypertension.  10. Question diabetes mellitus.  11. Family history of sudden death.   RECOMMENDATIONS:  1.  Re-evaluate of left ventricular function after three to six months after      initiation and maximization of medical therapy (i.e. beta blocker/ACE      inhibitor). 2.  Is anemia a contributing factor to the patient's symptoms?   CURRENT ISSUES:  1.   Non-sustained ventricular tachycardia is not a specific negative      prognosticator for arrhythmic death in non-ischemic cardiomyopathy (as      opposed to ischemic cardiomyopathy).  2.  Benefits of ICD in non-ischemic cardiomyopathy has only been studied in      patients with three to nine months of known left ventricular dysfunction      and on maximal medical  therapy.  3.  Need to review autopsy reports of the family members who died of sudden      death and to reassess implications of autopsy in view of these risks of      sudden cardiac death in surviving relatives.      GS/MEDQ  D:  03/24/2004  T:  03/24/2004  Job:  [id number]   cc:   Eduardo Osier. Harwani, M.D.  [address]     [address]  [place]  [place] [address]  Fax: [phone]

## 2010-06-17 NOTE — H&P (Signed)
[name], ALBRIGHT             ACCOUNT NO.:  [id number]   MEDICAL RECORD NO.:  [id number]          PATIENT TYPE:  INP   LOCATION:  1408                         FACILITY:  [facility]   PHYSICIAN:  Eric L. August Saucer, M.D.     DATE OF BIRTH:  [birth year]   DATE OF ADMISSION:  11/09/2005  DATE OF DISCHARGE:                                HISTORY & PHYSICAL   CHIEF COMPLAINT:  Increasing weakness with shortness of breath, confusion.   HISTORY OF PRESENT ILLNESS:  This is one of several [facility]  admissions for this 77 year old married black male with longstanding history  of nonischemic cardiomyopathy, Alzheimer disease, with recent diabetes  mellitus.  Patient had recently been hospitalized for congestive heart  failure and stabilized.  He had been treated temporarily at [facility] for approximately 1 month.  Approximately 1 week ago, he was  discharged back to home care.  Patient had been doing well up until the past  2 days by family's report.  He had been ambulatory and interactive.  Two  days ago, he became increasingly short of breath.  Son noted some transient  confusion with slurring of speech.  There was also some intermittent cough,  though nonproductive.  Yesterday, patient had become progressively weaker  and became more bedridden.  Today, his symptoms did not improve, and family  subsequently brought him over to the emergency room for further evaluation.  The son states that patient had been taking his medications as directed.  His appetite had been good up until recently.  Review of systems otherwise  notable for some dysuria and odor to the urine, with some increased  incontinency over the past 3 days as well.  Patient himself complains only  of some shortness of breath and weakness.   PAST MEDICAL HISTORY:  Well-documented on records.  He does have a history  of significant nonischemic cardiomyopathy secondary to hypertensive disease.  Also has Alzheimer  disease, diabetes mellitus, renal insufficiency, and  documented multi-vessel, though minimal, coronary artery disease.   Past medical history is also notable for a history of prostatic CA, followed  by Dr. Brunilda Payor.  His previous ejection fraction for his heart was 20% to 25%.   SOCIAL HISTORY:  Patient is married.  Has 3 adult children, alive and well.  Patient does not smoke or drink.  He is a retired [employer] manager.  He does  have a greater than 30-year history of second-hand smoke exposure.   ALLERGIES:  THE PATIENT HAS NO KNOWN ALLERGIES.   PRESENT MEDICATIONS:  1. Amaryl 1 mg daily.  2. Claritin 10 mg daily.  3. Multivitamins 1 daily.  4. MiraLax 17 g daily.  5. Lasix 40 mg daily.  6. Proscar 5 mg daily.  7. Namenda 5 mg b.i.d.  8. Coreg 12.5 mg b.i.d.  9. Atacand 16 mg b.i.d.  10.Isordil 10 t.i.d.  11.Aricept 10 mg nightly.  12.Haldol 0.5 mg daily.  13.Ativan 0.5 mg p.r.n.   PHYSICAL EXAMINATION:  He is a weak-appearing black male, initially in  significant respiratory distress.  VITAL SIGNS:  Blood  pressure 127/66, pulse 93, respiratory rate 36,  temperature 97.4, saturation 96% initially, height 5 feet 8 inches, weight  150 pounds.  Repeat sat was 96% on 2 L per nasal cannula.  HEENT:  Head normocephalic, atraumatic without bruits.  Extraocular muscles  are intact.  No scleral icterus.  No sinus tenderness.  Throat/posterior  pharynx clear.  Membranes slightly dry.  NECK:  Supple.  No enlarged thyroid.  He notable does have positive JVD at  30 degrees.  LUNGS:  Notable for decreased breath sounds in the right base with some  inspiratory crackles.  Early E to A changes on left versus right.  CARDIOVASCULAR:  Normal S1, S2.  Soft S3.  No rubs appreciated.  ABDOMEN:  Bowel sounds are present.  No enlarged liver or spleen, masses, or  tenderness.  RECTAL:  Deferred.  MUSCULOSKELETAL:  Good passive range of motion.  Notably negative edema  present bilaterally.  Negative  Homans'.  NEUROLOGIC:  Presently alert, oriented to person and place.  Negative to  time.  He does move all extremities with good grasp bilaterally.  Bilateral  dorsiflexion.   Chest x-ray demonstrated cardiac enlargement with vascular congestion.  There is a question of early pneumonia on the left.  CBC reveals WBC 8800,  hemoglobin 11.3, hematocrit 33.6; 85% polys, suggesting early left shift.  Chemistry:  sodium 148, potassium 4.7, chloride 116, CO2 22, BUN 38,  creatinine 1.9, glucose 209.  SGOT, SGPT within normal limits.  Total  bilirubin 1.2.  Albumin low at 3.4.  CK 86.  Troponin 0.09.  Calcium 9.7.  BNP greater than 3200.  Urinalysis:  pH 5.5, specific gravity 1.029.  There  were 21 to 50 WBCs, 0 to 2 RBCs, many bacteria.  EKG:  Normal sinus rhythm  with a left axis deviation.  Poor R-wave progression.  Nonspecific ST-T wave  changes noted.  He has a left anterior fascicular block as well.  Prolonged  Q-T interval noted.  Occasional PVCs as well.   IMPRESSION:  1. Congestive heart failure, symptomatic.  2. Rule out early right-sided failure.  3. Urinary tract infection.  Rule out sepsis.  4. Nonischemic cardiomyopathy.  5. Alzheimer disease with mild exacerbation of mental status change,      possibly secondary to occult infection.  6. Prostate cancer, conservatively managed.  7. Diabetes mellitus.  8. Rule out other.   PLAN:  Patient was admitted to telemetry at this time.  Started on Rocephin  1 g IV daily.  He was given IV Lasix in the emergency room with good  diuresis initially.  We will continue his Atacand and Coreg at this time.  Close monitoring over the next 24 hours to rule out sepsis.  We will treat  his failure more aggressively thereafter.  Low-dose nitrates at this time.  Cardiologic consultation with Dr. Sharyn Lull has been requested as well.  Further therapy pending response over the next 24 hours.           ______________________________ Lind Guest August Saucer,  M.D.     ELD/MEDQ  D:  11/09/2005  T:  11/11/2005  Job:  [id number]

## 2010-06-17 NOTE — H&P (Signed)
[name], [name]             ACCOUNT NO.:  [id number]   MEDICAL RECORD NO.:  [id number]          PATIENT TYPE:  INP   LOCATION:  1417                         FACILITY:  [facility]   PHYSICIAN:  Eric L. August Saucer, M.D.     DATE OF BIRTH:  03-Jul-1924   DATE OF ADMISSION:  10/31/2004  DATE OF DISCHARGE:                                HISTORY & PHYSICAL   CHIEF COMPLAINT:  Increasing shortness of breath, progressive confusion,  with decreasing appetite.   HISTORY OF PRESENT ILLNESS:  This is one of several [facility]  admissions for this 77 year old married black male with a long-standing  history of hypertension, progressive Alzheimer's disease, with fairly recent  history of nonischemic cardiomyopathy.  The patient had not been doing as  well over the past few weeks.  The family had noted that he had become  increasingly weaker with bouts of confusion.  Over the past week, he has had  increasing shortness of breath with nocturnal dyspnea.  They have noted  progressive non-productive count, as well.  The patient himself denies chest  pains or palpitations.  No abdominal complaints.  Appetite had been very  variable for a while.  The family, however, is able to confirm that his  eating habits have decreased considerably over this period of time, as well.  There has been some increasing lower extremity edema which has fluctuated.  The patient continued to have progressive confusion.  He, himself, denies  significant problems, other than occasional shortness of breath.  The  patient was seen in the office today and noted to be in worsening failure.  He had gained 7 pounds from his last office visit with lower extremity edema  being noted, as well as jugular venous distention.  He was subsequently  admitted at this time for further evaluation and therapy.   PAST MEDICAL HISTORY:  1.  His history is significant for his cardiac evaluation per Dr. Sharyn Lull.      He just recently has a  2-D echo on September 08, 2004 demonstrating an      ejection fraction of 25% to 30%.  The patient has had a cardiac      catheterization done in April of 2006, as well.  This, at that time,      demonstrated an ejection fraction of 20% to 25%.  His LAD showed only      10% to 15% minimal disease.  The LM and the right coronary artery was      patent, as well.  The patient's medical compliance has been      questionable.  He has periodically not taken his medications      consistently.  2.  He most recently has been found to have insulin resistance with early      diabetes mellitus, as well.  3.  A diagnosis of prostate CA, for which he has been followed by Dr. Brunilda Payor.  4.  He has a history of esophagitis with anemia, which was previously      associated with GI bleeding in the past.  5.  Previous CT  scan has demonstrated right basal ganglia infarction, as      well.   PAST SURGICAL HISTORY:  1.  A polypectomy done on his last colonoscopy on March 01, 2004.  2.  History of hernia repair on the right side, as well.  3.  He is status post cyst removal from the arm.  4.  Status post laser surgery to both arms.   ALLERGIES:  The patient has no known allergies.   SOCIAL HISTORY:  The patient is married.  A retired Psychologist, occupational.  He originally  had 4 children.  One of his sons died of sudden cardiac death.   REVIEW OF SYSTEMS:  Otherwise, as noted above.  Progressive Alzheimer's is  noted.  He has not had significant response to medication.   PRESENT MEDICATIONS:  1.  Aricept 5 mg, up to 10 mg daily.  2.  Prilosec 20 mg daily.  3.  Altace 10 mg b.i.d.  4.  Travatan eye drops.  5.  Coreg 12.5 mg p.o. daily.  6.  Proscar 5 mg daily.  7.  Fexofenadine 180 mg daily.   PHYSICAL EXAMINATION:  GENERAL:  He is a slender black male in no acute  distress.  VITAL SIGNS:  Height 5 feet, 11 inches.  Weight in the office was 167  pounds, which represented a 7 pound increase over the past month.  Blood   pressure 150/100, pulse 77, respiratory rate 24, temperature 97.3.  HEENT:  Head normocephalic and atraumatic without bruits.  Extraocular  muscles intact.  There was no sinus tenderness.  TMs without erythematous  changes.  Throat - posterior pharynx was clear.  NECK:  No enlarged thyroid.  No posterior cervical nodes.  LUNGS:  Notable for left basilar crackles.  No E to A changes appreciated.  Negative rub.  CARDIOVASCULAR:  Normal S1 and S2.  Intermittent S3 noted.  He did have JVD  distention at 30 degrees.  ABDOMEN:  Bowel sounds are present.  No masses or tenderness appreciated.  EXTREMITIES:  In the office, he had 1+ pitting edema bilaterally.  Negative  Homans.  There were 1+ dorsalis pedis pulses.  NEUROLOGIC:  He was alert, oriented to person and place.  Negative time.  Strength was intact and was symmetric.  SKIN:  Without active lesions.   EKG:  Normal sinus rhythm with left axis deviation.  Poor R wave progression  noted.   LABORATORY DATA:  CBC reveals a WBC of 6200, hemoglobin 13.9, hematocrit  42.7, 259,000 platelets, MCV of 89.5.  RDW of 15.1.  Chemistry revealed a  sodium of 139, potassium 3.5, chloride 105, CO2 of 26, BUN 22, creatinine  1.5, glucose elevated at 162.  SGOT of 38, SGPT of 51, albumin of 3.4,  calcium 8.7.  TSH pending.  BNP elevated at 2538.  Urinalysis pending.   CHEST X-RAY:  Pending at this time.   IMPRESSION:  1.  Nonischemic cardiomyopathy, progressive.  2.  Congestive heart failure, symptomatic.  The patient with exertional      dyspnea and progressive lower extremity edema.  3.  Alzheimer's disease, progressive.  4.  Failure to thrive with decreased appetite.  5.  History of prostate CA with recent initiation of __________.  He is      followed by Dr. Brunilda Payor.  6.  Insulin resistance with diabetes mellitus; questionable control.  7.  Atypical cough, probably secondary to ACE therapy.  Rule out other. 8.  History of esophagitis with occult  blood in  the past.   PLAN:  The patient is admitted to telemetry for further evaluation and  therapy.  Cardiology consultation.  Will stop his Altace and start an ARB.  He will need diuresis with close observation, as well.  Will obtain a strict  calorie count to document his intake at this time.  Reinstitution of Aricept  plus Namenda for control his dementia.  He had previously been on low-dose  Haldol for nighttime confusion.   Close observation to document his progress.  Further therapy pending his  response to the above regimen.  Close monitoring for nighttime confusion  with safety restraints as necessary.  Will proceed with CBG monitoring and  institution of medication as necessary.  Further therapy thereafter.           ______________________________  Lind Guest. August Saucer, M.D.     ELD/MEDQ  D:  10/31/2004  T:  11/01/2004  Job:  [id number]

## 2010-06-17 NOTE — H&P (Signed)
NAMEALMANDO, [name]             ACCOUNT NO.:  [id number]   MEDICAL RECORD NO.:  [id number]          PATIENT TYPE:  EMS   LOCATION:  MAJO                         FACILITY:  [facility]   PHYSICIAN:  [name], M.D.     DATE OF BIRTH:  02/09/1948   DATE OF ADMISSION:  04/18/2006  DATE OF DISCHARGE:                              HISTORY & PHYSICAL   CHIEF COMPLAINT:  Syncopal spell.   HISTORY OF PRESENT ILLNESS:  This one of several [facility]  admissions for this 77 year old married black male with a history of  congestive heart failure, dementia, diabetes mellitus, and renal  insufficiency.  He had been doing fairly well at the local rehab center,  [facility].  Today while participating in physical therapy the  patient became unresponsive while walking.  He subsequently was revived  with EMS intervention.  He was noted to have heart rates in the 40s and  50s at that time.  It is estimated that he was unresponsive for  approximately 20 minutes.  The patient was brought to the emergency  room.  At that time, he was found to be alert and oriented.  The patient  was subsequently admitted at this time for further evaluation and  therapy.  He notably denied any chest pain, shortness of breath, or  diaphoresis.  He is not known to have had similar episodes recently.   PAST MEDICAL HISTORY:  Notable for long-standing non-ischemic  cardiomyopathy.  History of Alzheimer's disease which has been slowly  progressive.  Recently hospitalized for pneumonia, discharged on  March 21, 2006.  Documented history of hypertension, diabetes  mellitus, mild renal insufficiency, and Alzheimer's disease.  He has had  bouts of deconditioning with recent hospitalizations, which has  responded to him going to a short-term rehab facility, and subsequently  returning home.   Notably, his previous cardiac evaluation has demonstrated an ejection  fraction of approximately 20%.  He has been  considered for an ICD in the  past, which had not been pursued due to his mental status and overall  physical state.   ALLERGIES:  THE PATIENT HAS NO KNOWN ALLERGIES.   SOCIAL HISTORY:  The patient is married and has 3 adult children.  Presently a resident at [facility].   PRESENT MEDICATIONS:  1. Aldactone 25 mg p.o. every day.  2. Altace 2.5 mg every day.  3. Amaryl 1 mg p.o. every day.  4. Avapro 150 mg b.i.d.  5. Coreg CR 40 mg every day.  6. Haldol 0.5 mg every day.  7. Lanoxin 0.125 mg every day.  8. Lasix 40 mg p.o. b.i.d.  9. MiraLax 17 grams p.o. daily.  10.Multivitamins one daily.  11.Namenda 5 mg p.o. b.i.d.  12.Proscar 5 mg p.o. every day.  13.Protonix 40 mg p.o. every day.   He has been on Novolin insulin as a sliding scale.  He has been  maintained on a 4 gram sodium diet.   PHYSICAL EXAMINATION:  GENERAL:  He is presently a weak-appearing black  male in no acute distress.  VITAL SIGNS:  Temperature 97.7, blood  pressure 115/59, pulse of 109,  respiratory rate 20.  HEENT:  Head is normocephalic, atraumatic.  No bruits appreciated.  No  sinus tenderness.  Extraocular muscles are intact.  No scleral icterus.  TMs are clear.  Nose:  Mild turbinate edema.  Edentulous.  Posterior  oropharynx clear.  NECK:  Supple.  No enlarged thyroid.  No carotid bruits appreciated.  LUNGS:  Notable for a few left basilar rhonchi.  No wheezes.  No EUA  changes.  CARDIOVASCULAR EXAM:  Normal S1, S2.  No S3 appreciated.  1/6 systolic  ejection murmur in the lower sternal border.  No rales appreciated.  ABDOMEN:  Soft.  No tenderness.  RECTAL:  Deferred at this time.  EXTREMITIES:  Negative Homan's bilaterally.  No edema.  Decreased range  of motion at the knees.  He has dorsal flexion bilaterally.  NEUROLOGICALLY:  He is alert, oriented to person.  Negative to place and  time.  Moves all extremities appropriately.  Grasp was intact.  SKIN:  Without active lesions.    LABORATORY DATA:  CBC revealed a WBC of 5700, hemoglobin 12.3,  hematocrit of 37.5.  188,000 platelets.  Normal differential.  Chemistry:  Sodium 140, potassium 4.5, chloride 99, CO2 of 31, BUN of  37, creatinine 1.69, glucose of 102.  Total protein 6.9.  Albumin 3.5.  CT scan of the head without acute events.  A D-dimer was elevated at  2.67.  BMP mildly elevated at 188.   IMPRESSION:  1. Syncopal spell of questionable etiology.  Rule out secondary to      silent PE versus arrhythmia versus other.  2. End-stage congestive failure, presently compensated.  3. Alzheimer's disease, with mild progression.  4. Renal insufficiency.  5. Diabetes mellitus.  6. Deconditioning.  7. History of BPH.   PLAN:  The patient is admitted to telemetry at this time.  Will be  placed on Lovenox for PE prophylaxis pending spiral CT scan evaluation.  Will continue his cardiac medications and psychotropic medications at  this time.  Will follow his CBGs and treat his diabetes appropriately.  Further therapy and cardiologic evaluation pending results of the above.           ______________________________  Lind Guest. August [name], M.D.     ELD/MEDQ  D:  04/18/2006  T:  04/19/2006  Job:  [id number]

## 2010-06-17 NOTE — H&P (Signed)
NAMEALPHUS, [name]             ACCOUNT NO.:  [id number]   MEDICAL RECORD NO.:  [id number]          PATIENT TYPE:  INP   LOCATION:  1431                         FACILITY:  [facility]   PHYSICIAN:  Eric L. August Saucer, M.D.     DATE OF BIRTH:  Jun 10, 1924   DATE OF ADMISSION:  03/11/2006  DATE OF DISCHARGE:                              HISTORY & PHYSICAL   CHIEF COMPLAINT:  Progressive weakness, shortness of breath, and  confusion.   HISTORY OF PRESENT ILLNESS:  This is one of several [facility]  admissions for this 77 year old married black male with longstanding  nonischemic cardiomyopathy, Alzheimer's disease, and diabetes mellitus.  The patient has had multiple admissions for congestive heart failure and  associated pneumonia.  He had been doing well up until the past 3 days,  per family's report.  They noted that he began having an increasing  weakness and becoming more sedentary over the past few days.  Appetite  had decreased as well.  He was noted to have an occasional nonproductive  cough.  There was no associated fever or chills.  They did note that he  had become more confused.  Last night he was noted to have increasing  difficulty breathing.  There was small increased confusion as well.  The  son became concerned, and brought the patient to the emergency room for  further evaluation.  Patient was noted to have probable pneumonia by  chest x-ray.  There was also evidence for worsening congestive failure  with BNP over 1500.   PAST MEDICAL HISTORY:  Notable for multiple admissions for  decompensation of his congestive heart failure.  Previous history of  hypertensive disease as well.  He has known mild renal insufficiency.  Coronary artery disease has been minimal.  His previous ejection  fractions on evaluation have been between 20 and 25%.  His past history  otherwise is significant for prostatic CA followed by Dr. Lindaann Slough.  His cardiologist has been Dr.  Sharyn Lull.   After the patient's most recent admission, he had gone to the [facility] for rehabilitation.  He did do well; and was subsequently  discharged home and functional.  The patient unfortunately by the  patient's son's report does not consistently get his medication on a  timely basis due to his wife's altered sleep-wake cycle.   ALLERGIES:  The patient has no known allergies.   SOCIAL HISTORY:  The patient is married, has three adult children alive  and well.  The patient does not smoke or drink although he is exposed to  secondhand smoke significantly from his wife.  He is a retired Systems developer.   PRESENT MEDICATIONS:  1. Coreg CR 40 mg daily.  2. Glimepiride 1 mg daily.  3. Haldol 0.5 mg daily at 6:00 p.m.  4. Namenda 5 mg b.i.d.  5. Potassium chloride 20 mg daily.  6. Prilosec 20 mg daily.  7. Proscar 5 mg daily.  8. The patient had previously also been on Lasix at 40 mg daily and      digoxin 0.125 mg at  the time of his most recent discharge.   PHYSICAL EXAM:  GENERAL:  He is weak-appearing black male in no acute  distress on oxygen.  VITAL SIGNS:  Initially revealed a blood pressure of 158/101, pulse 94,  respiratory rate 18, temperature 97.7.  Most recent blood pressure at  0752 blood pressure of 130/96, pulse of 76, respiratory rate 20,  temperature of 97.2 as noted, 97% saturation on 2 liters of O2.  HEENT: Head normocephalic, atraumatic.  Extraocular muscles are intact.  There is no sinus tenderness.  TMs have decreased light reflex.  Throat:  Posterior pharynx is clear.  NECK:  Supple.  No enlarged thyroid.  No carotid bruits are appreciated.  LUNGS:  He had bibasilar rales, though somewhat distant.  E:A changes  bilaterally right greater than left.  No rub.  CARDIOVASCULAR EXAM:  Normal S1-S2, soft S3 appreciated.  He did have positive JVD at 30  degrees.  No ectopy appreciated.  ABDOMEN:  Bowel sounds are present.  No masses or tenderness.   EXTREMITIES:  Negative Homan's.  Notably no edema at this time.  He has  trace to 1+ dorsalis pedis pulses bilaterally.  SKIN:  Without active  lesions.  NEUROLOGIC:  He is alert, oriented to person.  He knew he was in the  hospital but not to name.  Not oriented to time.  Moves all extremities.  Grips are intact.   The patient notably has early Cheyne-Stokes breathing on observation.   LABORATORY DATA:  CBC revealed WBC of 5800, hemoglobin 12.0, hematocrit  of 36.4.  Platelets 232,000.  Neutrophils 63%.  Electrolytes:  Sodium  147, potassium 4.1, chloride 116, CO2 of 24, BUN of 29, creatinine of  1.69, glucose of 141, calcium 9.5.  Estimated GRF 48 mL per minute.  Urinalysis a pH of 5.5, specific gravity 1.031, dipstick is negative at  this time.  Myoglobin was 104, troponin less than 0.05.   CHEST X-RAY:  Demonstrates bilateral pulmonary infiltrates left greater  than right; question pneumonia versus a edema.  Infiltrate in the left  lung had increased from previous study.   EKG REVEALS:  A sinus rhythm; left axis deviation; has a pulmonary  disease pattern with an incomplete left bundle branch block, and  nonspecific ST-T wave abnormality.   IMPRESSION:  1. Bilateral pneumonia.  2. Rule out atypical pulmonary edema.  3. Congestive heart failure with probable decompensation.  4. Rule out pulmonary hypertension with right-sided failure.  5. Diabetes mellitus, questionable control recently.  6. Alzheimer's disease.  7. Mild renal insufficiency.  8. Severe secondhand smoke exposure greater than 30 years.  9. History of prostate cancer.  10.Questionable medically safe home environment.  The patient is      beginning to demonstrate needing more home care at this time, than      what family has been able to provide.   PLAN:  The patient has been admitted to telemetry at this time.  He has  been placed on Rocephin and Zithromax empirically for treatment of his ammonia.  Will obtain  sputum specimens if at all possible.  We will  maximize his cardiac medications for his heart failure as well.  He will  be placed on a sliding scale for his diabetes.  Monitor his  oral intake as well.  Reevaluate his heart status with a 2-D echo to  evaluate right and left ventricular function.  Will ask care management  to evaluate home situation; there is need for further  assistance.  Further therapy pending response to the above.           ______________________________  Lind Guest. August Saucer, M.D.     ELD/MEDQ  D:  03/11/2006  T:  03/11/2006  Job:  [id number]

## 2010-06-17 NOTE — Discharge Summary (Signed)
[place]  Patient:    [name], [name] Visit Number: [id number] MRN: [id number]          Service Type: MED Location: [phone] Attending Physician:  Gwenyth Bender Dictated by:   Lind Guest. August Saucer, M.D. Adm. Date:  08/14/2000 Disc. Date: 08/17/2000                             Discharge Summary  DISCHARGE DIAGNOSES: 1. Syncope and collapse. (780.2) 2. Cardiac dysrhythmia. (427.9) 3. Hypertension. (401.9) 4. Anemia. (285.9)  PROCEDURES:  None.  HISTORY OF PRESENT ILLNESS:  This was the first recent [facility] admission for this 77 year old, married, African-American male who was basically doing well until approximately 18 hours prior to admission.  The patient and wife were on a bus traveling back from [place] area at approximately 1 a.m., when the patient while sitting developed sudden sensation of warmth and being flushed.  The patient, thereafter, proceeded to have a syncopal spell.  He was not observed to have any tonic-clonic activity. He did become incontinent.  There was no loss of respirations or pulse.  The patient was placed on a long bench on the bus where he gradually became more alert.  The actual time of unconsciousness is not clear.  He was later seen by EMS, but chose to continue on his journey back to [place].  The patient denied chest pain or palpitations.  There is no history of presyncopal spells prior to this.  He notably over the past week have been eating more pork than usual.  He had also been drinking an excessive amount of caffeine which he normally does not drink.  The patient does not smoke, although his wife smokes less than 1/2 pack of cigarettes per day.  He had been under increased stress associated with illness with family members.  He also acknowledges having difficulty sleeping for the past several days prior to this as well.  Past medical history and physical exam is in the admission  H&P.  HOSPITAL COURSE:  The patient was admitted for further evaluation of syncope. There was strong concern for possible underlying arrhythmia versus a vasovagal reaction versus other.  He was placed on telemetry for close observation. Cardiac enzymes were obtained q.8h. x 3 as his initial CPK was 322, although MB was negative and troponin was within normal limits.  He was noted to have borderline anemia with a hemoglobin of 12.6.  Stool checks, however, were negative for occult blood.  The patient was seen in consultation by neurology. It was felt that he may have experienced a vasovagal reaction versus seizure versus other.  MRI scan of the head was ordered to rule out recent CVA.  He was noted to have small vessel disease without a specific defect being seen. Notably, at the time of admission, he had EKG that showed normal sinus rhythm with nonspecific T wave changes.  A 2D echocardiogram was performed by Dr. Algie Coffer after consultation.  He was noted to have mild to moderate inferior hypokinesis with mild AI and mild mitral regurgitation.  He subsequently underwent a stress Cardiolite on August 16, 2000.  This was found to be negative for ischemia.  The patient was monitored further and became more ambulatory.  He had no further presyncopal or syncopal spells.  Blood pressure remained stable throughout this time.  It was felt that the patient may have had a transient vasovagal reaction.  The possibility of a transient arrhythmia could not be completely excluded.  The patient, however, was felt to be stable for discharge.  He was noted, as above, to be borderline anemic. Further evaluation would be pursued as an outpatient with colonoscopy.  DISCHARGE MEDICATIONS: 1. Norvasc 2.5 mg q.d. 2. Toprol XL 25 mg b.i.d. 3. Baby aspirin 81 mg p.o. q.d., enteric coated.  DIET:  No added salt, avoiding caffeine diet.  ACTIVITY:  As tolerated.  FOLLOWUP:  Return in two weeks time for follow-up  appointment. Dictated by:   Lind Guest. August Saucer, M.D. Attending Physician:  Gwenyth Bender DD:  09/26/00 TD:  09/27/00 Job: [phone] [id number]

## 2010-06-17 NOTE — H&P (Signed)
[place]. [facility]  Patient:    [name], [name]                      MRN: [id number] Adm. Date:  [id number] Attending:  Gwenyth Bender                         History and Physical  CHIEF COMPLAINT: Syncope, new onset.  HISTORY OF PRESENT ILLNESS: This is the first recent [place] admission for this 77 year old married black male, who was previously doing well until approximately 18 hours prior to admission.  The patient and wife were on a bus traveling back from the [place] area at approximately 1 a.m. and on board the patient while sitting developed sudden sensation of warmth and being flushed.  He thereafter proceeded to have a syncopal spell. The patient was not observed to have any tonoclonic activity.  He did become incontinent.  He never lose respirations or pulse.  The patient was placed on a long bench on the bus.  He gradually became more alert.  It is unclear as to the actual time of his unconsciousness (family not available at this time). The patient was later seen by EMS but chose to continue on his journey back to [place], [place].  The patient denied chest pain or palpitations. No history of any presyncopal spells prior to this.  The patient notably over the past week had been eating more pork than usual. He also had been drinking an excessive amount of caffeine, which he normally does not drink.  The patient does not smoke, though his wife smokes less than half a packs of cigarettes a day on a regular basis.  He had been under increased stress associated with illness in his family.  He acknowledges having difficulty sleeping for the past several days prior to this as well.  He denied any difficulty voiding.  No recent problems with constipation. Otherwise doing well.  The patient was advised to see his physician today and called the office for further evaluation.  PAST MEDICAL HISTORY:  1. Long-standing  hypertension.  2. History of glaucoma.  No associated arrhythmia history.  The patient had only recently come back to the office for a comprehensive physical in April 2002.  He had had some sporadic follow-up thereafter.  REVIEW OF SYSTEMS: As noted above.  He has had intermittent right hip and knee pain, for which he had been followed by Dr. Jerl Santos.  No other new complaints.  PRESENT MEDICATIONS:  1. Maxzide 25 mg p.o. q.d.  2. Norvasc 5 mg q.d., with questionable compliance.  3. Cosopt eye drops, one drop OU b.i.d.  The patient when last seen in April 2002 was advised to obtain blood work as he had not had any in two years.  He had not obtained his blood work as of this date.  SOCIAL HISTORY: The patient is married.  He is retired.  Appears to be without specific complaints.  FAMILY HISTORY: Notably, he has two sister, both of whom are ill.  PHYSICAL EXAMINATION:  GENERAL: He is well-developed, well-nourished black male, presently in no acute distress.  VITAL SIGNS: Height 5 feet 11 inches.  Weight 188 pounds.  Blood pressure 150/90 lying, 156/80 sitting, standing 140/100.  Pulse 100.  Respiratory rate 20.  Temperature 97.9 degrees.  HEENT: Normoactive, atraumatic, without bruits.  EOMI.  Fundi grade 1.  No sinus tenderness.  Nose,  mild turbinate edema bilaterally.  TMs with decreased light reflex.  Throat, edentulous lower half.  Post pharynx clear.  NECK: Supple.  No enlarged thyroid.  No carotid bruits.  CHEST: Lungs clear to auscultation and percussion.  No E-A changes.  CARDIOVASCULAR: He has a soft S4.  Normal S1 and S2.  No S3.  No murmur appreciated.  No carotid bruits.  ABDOMEN: Bowel sounds present.  No enlargement of liver or spleen.  No masses or tenderness.  RECTAL: Deferred.  (Done on May 28, 2000 with 1-2+ prostate, without nodules; stool heme negative at this time).  MUSCULOSKELETAL: He has tenderness over the right hip to deep palpation.   Full passive range of motion in both hips.  Negative Homans.  Pulses 1+ bilaterally.  NEUROLOGIC: He is alert and oriented x 3.  Cranial nerves intact.  Extraocular movements were intact without nystagmus.  EXTREMITIES: Negative drift in upper extremities.  He has 4/5 dorsiflexion of the right foot versus 5/5 on the left.  He has absent Babinskis on the left, equivocal Babinskis on the right.  LABORATORY DATA: CBC revealed a WBC of 5200, hemoglobin decreased at 12.6, hematocrit 37.1; platelets 261,000; MCV 86.1; RDW 13.7.  Chemistries showed a sodium of 141, potassium 3.6, chloride 104, CO2 32, BUN 34, creatinine 1.7, glucose 117.  PT 14.1, INR 1.1.  CPK 322, MB within normal limits.  SGOT 25, SGPT 19, alkaline phosphatase 42.  Calcium 9.5.  EKG shows normal sinus rhythm with left anterior fascicular block, interventricular conduction delay, nonspecific ST-T wave changes; scattered unifocal PVCs (previously not seen on old tracing).  IMPRESSION:  1. Syncope, new onset, of questionable etiology.  Rule out underlying     arrhythmia induced by recent dietary change and stress.  Rule out seizure     disorder.  2. History of hypertension, presently stable.  3. Abnormal electrocardiogram.  Rule out significant coronary artery disease.  4. Borderline neurologic examination.  Patient with slight weakness in the     right lower extremity versus the left.  Will need to rule out central     nervous system disease.  5. Mild anemia, questionable etiology.  This will need further evaluation     as well.  PLAN: Patient admission to telemetry to rule out arrhythmia.  Will obtain CK-MB q.8h x 3 to exclude present heart disease.  Neurologic evaluation.  Need EEG and MRI scan of the head as well.  Will obtain cardiology evaluation for possible arrhythmia induced syncope.  Will guaiac all stools.  Re-evaluate anemia indices.  Further therapy pending results of above. DD:  08/14/00 TD:   08/15/00 Job: [id number]

## 2010-06-17 NOTE — Consult Note (Signed)
[name], [name]             ACCOUNT NO.:  [id number]   MEDICAL RECORD NO.:  [id number]          PATIENT TYPE:  INP   LOCATION:  4738                         FACILITY:  [facility]   PHYSICIAN:  Corky Crafts, MDDATE OF BIRTH:  11/29/1924   DATE OF CONSULTATION:  09/06/2005  DATE OF DISCHARGE:                                   CONSULTATION   REASON FOR CONSULTATION:  Congestive heart failure.   HISTORY OF PRESENT ILLNESS:  The patient is a 77 year old man with history  of nonischemic cardiomyopathy diagnosed by catheterization whose ejection  fraction in the past has been 20-25%.  He has minimal coronary artery  disease by cath which was last done in 2006.  He also has diabetes and  Alzheimer's disease.  The history was obtained by the patient's family.  He  has chronic shortness of breath which is worse with exertion.  He apparently  had been reporting worsening shortness of breath for [redacted] weeks along with  orthopnea and lower extremity edema.  He had some weakness and a 5-pound  weight gain over a short period of time.  He denies any chest pain,  palpitations, diaphoresis, nausea or dizziness.  His BNP was increased.  We  have been asked to consult for a second opinion on this congestive heart  failure.   PAST MEDICAL HISTORY:  1. Congestive heart failure secondary to nonischemic cardiomyopathy with      ejection fraction of 20-25%.  2. Hypertension.  3. Diabetes.  4. Alzheimer's.  5. Prostate cancer.  6. Deconditioning  7. Medical noncompliance.   ALLERGIES:  NO KNOWN DRUG ALLERGIES.   CURRENT MEDICATIONS:  1. Spironolactone 25 mg daily.  2. Coreg CR 40 mg daily  3. Atacand 60 mg b.i.d.  4. Lasix 20 mg daily.  5. Aricept 10 mg at bedtime  6. Amaryl 2 mg daily.  7. Proscar 5 mg daily.  8. Fexofenadine 180 mg daily.  9. Megace.   FAMILY HISTORY:  Noncontributory.   SOCIAL HISTORY:  The patient is married with three adult children.  He is a  retired Systems developer.  He does not smoke, drink, or use any illegal drugs.   REVIEW OF SYSTEMS:  No chest pain.  Shortness of breath, leg swelling awake  and as noted above.  No cough.  He reports diffuse weakness.  All other  systems negative.   PHYSICAL EXAM:  Blood pressure 02/22/1970, pulse 64.  GENERAL:  In general the patient is awake but confused.  He is in no  apparent distress.  HEENT:  Head normocephalic, atraumatic.  EYES:  Extraocular movements intact.  NECK:  No carotid bruits.  LUNGS:  Mild bibasilar crackles.  CARDIOVASCULAR:  Regular rate and rhythm.  S1, S2, soft systolic murmur.  ABDOMEN:  Soft, nontender, nondistended.  EXTREMITIES:  Showed no edema.  SKIN:  No rash.  NEURO:  The patient is confused.  BACK:  No kyphoscoliosis or scoliosis.   LAB WORK:  CK of 94, MB of 3.7, troponin 0.34.  BNP of 1503.  Hematocrit of  39.6.  Chest x-ray shows cardiomegaly with  vascular congestion.  No acute  chest findings.  EKG shows normal sinus rhythm, left anterior fascicular  block, nonspecific ST-T wave abnormality.   MEDICAL INCISION MAKING:  A 77 year old with congestive heart failure.   PLAN:  1. The patient is on good medical therapy including an ARB, beta blocker      as well as spironolactone.  He has been diuresed with IV Lasix.  This      potentially can be switched to p.o. Lasix soon as it seems that he is      in better fluid balance.  2. Would continue to monitor electrolytes while he is receiving IV      diuretics.  3. Will check an echocardiogram to evaluate for valvular issues which may      be contributing to his congestive heart failure such as mitral      regurgitation.  4. Blood pressure and heart rate her well-controlled on current medical      therapy.  5. Given his prolonged nonischemic cardiomyopathy sometimes an ICD may be      used; however, in this patient's case, he is quite confused and already      quite weak in general.  I am doubtful that he would really  tolerate      this therapy.  6. Will follow while he is in the hospital.      Corky Crafts, MD  Electronically Signed     JSV/MEDQ  D:  09/07/2005  T:  09/07/2005  Job:  [id number]

## 2010-06-17 NOTE — Discharge Summary (Signed)
[name], [name]             ACCOUNT NO.:  [id number]   MEDICAL RECORD NO.:  [id number]          PATIENT TYPE:  INP   LOCATION:  4738                         FACILITY:  [facility]   PHYSICIAN:  Eric L. August Saucer, M.D.     DATE OF BIRTH:  10-Aug-1924   DATE OF ADMISSION:  09/05/2005  DATE OF DISCHARGE:  09/25/2005                                 DISCHARGE SUMMARY   PRIORITY DISCHARGE SUMMARY   FINAL DIAGNOSES:  1. Congestive heart failure, compensated.  2. Nonischemic cardiomyopathy secondary to hypertensive disease.  3. Alzheimer's disease, progressive.  4. Diabetes mellitus, stable.  5. Mild renal insufficiency.  6. Deconditioning, multifactorial.  7. Minimal multivessel coronary artery disease.   OPERATIONS/PROCEDURES:  Two-D echocardiogram.   HISTORY OF PRESENT ILLNESS:  This was one of several [facility]  admissions for this 77 year old married black male with a history of  Alzheimer's disease, previously documented nonischemic cardiomyopathy, and  congestive heart failure.  The patient had a progressive decline over the  past few weeks prior to admission.  This was associated with increasing  weakness, shortness of breath, and decreasing activity.  The family reports  that his appetite had been good.  There had been varying reports of whether  the patient had been taking his medications consistently or not.  The  family, however, has become increasingly concerned that the patient had  gotten increasingly weaker with decreased activity.  Most recently followup  laboratory data's have demonstrated a BNP greater than 1200.  With his  overall decline and function, the patient was admitted for further  evaluation and stabilization of his congestive heart failure.  The patient  himself denied chest pains, palpitations, or shortness of breath.  Due to  his confusion, however, he is found to be a poor historian.  He has had a  poor gait as well.   PAST MEDICAL  HISTORY/PHYSICAL EXAMINATION:  As per admission H&P.   HOSPITAL COURSE:  The patient was admitted for further treatment of his  congestive heart failure.  He was noted to have ongoing problems with  confusion associated with the previously documented Alzheimer's disease as  well.  The patient was placed on telemetry.  Cardiac enzymes were obtained  q.8h x3, which were essentially negative for any acute injury.  The patient  was started on IV Lasix at a dose of 20 mg IV q.12h.  He was continued on  his ARBs.  He was started on Isordil at a dose of 10 mg t.i.d. as well.  Strict dietary monitoring was obtained as well.  A two-D echo was done,  which demonstrated severe LV systolic dysfunction with global hypokinesis.  His ejection fraction was 20 to 25%.   The patient was seen in consultation by [facility] per family's  request.  The issue of insertion of an AICD was discussed but deemed  inappropriate due to the patient not being a good candidate.  He was  continued thereafter on his regimen of IV Lasix, nitrates, and ARB therapy.  He made steady improvement with this clinically by means of his BNP  assessments.  The patient himself remained relatively asymptomatic  throughout this process.  Notably on September 09, 2005, he was noted to have  an episode of mild sustained V-tach.  At that time, his potassium had  dropped to 3.1 as well.  This was replenished.  His Coreg medications and  nitrates were continued.  His magnesium was maintained to greater than 2.0  as well.  He had no further symptoms thereafter.   With continued therapy, the patient began to develop problems with mild  hypotension.  Systolic pressures ran as low as 92/60.  In view of his  dementia, it was deemed inappropriate to have a low blood pressure.  His  diuretics and nitrates were thereafter decreased and adjusted pending daily  measurement of his blood pressure.  The goal was to keep his systolic  greater than  100.   Eventually with intensive therapy, the patient's overall clinical status did  stabilize.  His IV Lasix was switched to p.o. Lasix at 40 mg daily.  With  this, his BNP gradually rose again to a maximum of 700.  An additional low  dose Lasix was added in the afternoon p.o.  He tolerated this reasonably  well with gradual decline in his BNP with minimal rise of his creatinine.   The issue regarding the patient's ambulatory status was addressed.  A  Physical Therapy consultation was obtained.  Despite several attempts at  ambulation, the patient remained too confused at times and/or was not  agreeable to ambulation.  Physical therapy subsequently was discontinued  after three attempts.   The patient did have one severe episode of sundowning while hospitalized.  This was improved with low dose Haldol in the evening.  His Aricept was  continued.  He was also started on low dose Namenda.  This will need to be  gradually increased as tolerated over the next several weeks to build up to  10 mg b.i.d.   With the patient's multiple medical issues as well as medications and close  followup needed, it was felt by the family that they would not be able to  care for patient adequately at home.  Efforts thereafter were pursued  regarding a skilled nursing facility placement.  It was hoped that patient  would be a candidate for some physical therapy as well.   By September 24, 2005, the patient was feeling much better.  He would have  greater periods of alertness.  Vital signs were stable.  On August 26th, his  BNP was 365.  BUN of 26, creatinine 1.8, potassium 4.5.  Blood pressure  102/65.  After further review with the family, it was determined that  patient would indeed continue with the original plan of going into a skilled  nursing facility.  This will be arranged on September 25, 2005.   PRESENT MEDICATIONS AT TIME OF DISCHARGE:  1. Coreg 12.5 mg p.o. b.i.d. 2. Megace 40 mg b.i.d.  3.  Aricept 10 mg q.h.s.  4. Proscar 5 mg p.o. daily.  5. Lovenox 40 mg subcu daily.  6. Claritin 10 mg daily or fexofenadine 180 mg daily.  7. Multivitamins with minerals one daily.  8. MiraLax 17 gm p.o. daily for constipation.  9. Potassium chloride 20 mEq daily.  10.Lasix 40 mg in the a.m., 20 mg in the p.m.  11.Amaryl 1 mg p.o. q.a.m.  12.Haldol 0.5 mg daily at 6 p.m.  13.Isordil 10 mg p.o. t.i.d.  14.Namenda 5 mg daily.  15.Atacand 16 mg p.o. b.i.d.  16.Ensure with fiber one can b.i.d. or Glucerna.  17.Tylenol 650 mg q.4h p.r.n. pain.   The patient will be maintained on a 4 gm sodium, no concentrated sweets diet  as tolerated.  Will continue his CBGs a.c. breakfast and dinner p.r.n., more  frequent pending stability.  Efforts at physical therapy should be continued  as well.           ______________________________  Lind Guest. August Saucer, M.D.     ELD/MEDQ  D:  09/24/2005  T:  09/24/2005  Job:  [id number]

## 2010-06-17 NOTE — Consult Note (Signed)
[facility]  Patient:    [name], [name]                      MRN: [id number] Proc. Date: 08/15/00 Adm. Date:  [id number] Attending:  Gwenyth Bender CC:         Lind Guest. August Saucer, M.D.  [facility], [address] [facility].   Consultation Report  HISTORY OF PRESENT ILLNESS:  [name] is a 77 year old black gentleman born [birth year] with a history of single syncopal event.  The patient claims no prior history of blackout episodes but was on a bus back from [place] the day prior to admission when he felt hot and flushed during the trip.  The patient took his jacket off and then wife noted he slowly slumped down in the seat without stiffening or jerking or tongue biting.  The patient did lose control of his bladder.  The patient does not recall much about the events.  He does not believe he was diaphoretic or nauseated.  The patient was unconscious for about 5-7 minutes.  He came to slightly confused.  The patient was seen by EMS but did not go to the hospital.  He was placed back on the bus and went back down to [place].  The patient comes in at this point for further evaluation.  The patient denies headaches; focal numbness or weakness of the face, arms, or legs; visual changes, double vision, loss of vision; slurred speech; difficulty swallowing.  PAST MEDICAL HISTORY: 1. History of hypertension. 2. History of glaucoma. 3. History of syncope, as above. 4. History of right-sided hernia repair.  ALLERGIES:  The patient has no known allergies.  CURRENT MEDICATIONS: 1. Maxzide 25 mg daily. 2. Norvasc 5 mg a day. 3. Cosopt eyedrops 1 drop [name] b.i.d.  SOCIAL HISTORY:  The patient does not smoke, drink, or use drugs.  The patient lives with his wife and daughter in the [place] area.  Currently is not employed.  FAMILY HISTORY:  Mother died in her 21s.  Father died in his 15s.  Father had glaucoma with blindness.  One  of the patients children died with an MI.  One has a brain tumor.  REVIEW OF SYSTEMS:  No fevers or chills.  The patient denies any prior syncope.  Denies dizziness with standing.  Denied shortness of breath, chest pain, palpitations around the time of the syncopal event.  The patient denies any focal numbness or weakness of the face, arms, or legs.  Denies any gait instability, confusion spells.  PHYSICAL EXAMINATION:  VITAL SIGNS:  Blood pressure 144/104.  No significant postural drop was noted. Heart rate 75.  HEENT:  Head is atraumatic.  Eyes:  Pupils are equal, round and reactive to light.  Disks are flat bilaterally.  NECK:  Supple.  No carotid bruits noted.  RESPIRATORY:  Clear to auscultation and percussion.  CARDIOVASCULAR:  Regular rate and rhythm.  No obvious murmurs or rubs noted.  EXTREMITIES:  Without significant edema.  NEUROLOGIC:  Cranial nerves as above.  Facial symmetry was noted.  The patient has good sensation of the face to pinprick and soft touch bilaterally. The patient has good strength to facial muscles and the muscles to head turning and shoulder shrug bilaterally.  Tongue strength is normal and muscles of mastication are normal strength.  The patient has good strength on all fours. Good symmetric motor tone is noted throughout.  Sensory testing is intact  to pinprick, soft touch, vibratory sensation throughout.  Deep tendon reflexes are depressed but symmetric.  Toes are neutral bilaterally.  The patient has fair finger-nose-finger and heel-to-shin bilaterally.  The patient walks with a slightly wide-based gait.  He performed tandem gait, which was slightly unsteady.  Romberg negative.  No evidence of pronator drift is seen.  LABORATORY DATA:  White count 5.2, hemoglobin 12.6, hematocrit 37.1, MCV 86.1, platelets 261.  INR is 1.1.  Sodium 141, potassium 3.6, chloride 104, CO2 32, glucose 117, BUN 34, creatinine 1.7, total bilirubin 0.5, alkaline  phosphatase 42, SGOT 25, SGPT 19, total protein 6.8, albumin 3.9, calcium 9.5.  CPK level was 322.  MB fraction 4.4.  Troponin-I level is 0.02.  Iron is 48, ferritin 140, transferrin 161.  Urinalysis reveals specific gravity 1.022, pH of 6.0.  IMPRESSION:  History of syncope.  The patient has an unremarkable examination at this point.  The patient is at or near baseline.  We will pursue a bit further workup to rule out a neurologic cause of syncope.  The patient likely has had simple faint or vasovagal syncope.  No evidence as of yet on the cardiac monitor of any significant cardiac rhythm abnormalities.  Doubt seizure or vertebrobasilar insufficiency.  PLAN: 1. MRI of the brain and would also recommend MR angiogram of the    vertebrobasilar system. 2. EEG study. 3. Cardiac monitoring, as above.  We will follow clinically while in-house. DD:  08/15/00 TD:  08/16/00 Job: [id number]

## 2010-06-17 NOTE — Discharge Summary (Signed)
[name], [name]             ACCOUNT NO.:  [id number]   MEDICAL RECORD NO.:  [id number]          PATIENT TYPE:  INP   LOCATION:  1408                         FACILITY:  [facility]   PHYSICIAN:  Eric L. August Saucer, M.D.     DATE OF BIRTH:  07-23-1924   DATE OF ADMISSION:  11/09/2005  DATE OF DISCHARGE:  11/16/2005                                 DISCHARGE SUMMARY   FINAL DIAGNOSES:  1. Left upper lobe pneumonia.  2. Congestive heart failure.  3. Urinary tract infection.  4. Nonischemic cardiomyopathy.  5. Alzheimer's disease.  6. Prostatic cancer.  7. Diabetes mellitus.  8. Renal insufficiency.  9. Deconditioning.   OPERATIONS AND PROCEDURES:  None.   HISTORY OF PRESENT ILLNESS:  This is one of several [facility]  admissions for this 77 year old married black male with longstanding history  of nonischemic cardiomyopathy, Alzheimer's disease with recent diagnosis of  diabetes mellitus.  The patient has been recently hospitalized with  congestive heart failure and stabilized.  He had been treated temporarily at  [facility] for approximately 1 month.  One week ago, the patient  was discharged back to home.  He had been doing well since that time up  until 2 days prior to admission per family's report.  He had been ambulatory  and interactive.  Over the past 2 days prior to admission, he had become  increasingly short of breath.  His family has noted transient confusion with  slurred speech.  He gradually became more bed-ridden as well.  He also  developed intermittent cough, though nonproductive.  There was no documented  fever.  The patient continues to decline, and the patient subsequently was  taken to the hospital for further evaluation.  His family reports that he  had been taking some medications on a regular basis.  Appetite had been good  up until recently.   REVIEW OF SYSTEMS:  Notable for some recent dysuria.  He had also had some  increasing urinary  incontinency over the past 3 days prior to admission.   Past medical history and physical exam is per admission H&P.   HOSPITAL COURSE:  The patient was admitted for further evaluation of  congestive heart failure.  Note, his chest x-ray also demonstrated evidence  of left upper lobe pneumonia.  At the time of admission, he had a white  count of 88,000.  He had a left shift to his neutrophils as well.  BNP was  done which was greater than 3200.  Cardiac enzymes were negative.  The  patient was admitted to telemetry.  Blood cultures were obtained.  A urine  culture was ordered as well.  The patient was placed on IV Rocephin.  He was  given a large dose of IV Lasix in the emergency room initially with  aggressive diuresis.  No further Lasix was given initially for the first 24  hours pending ruling out sepsis.  His blood pressure, however, did remain  stable over the subsequent 24 hours.   Cardiac enzymes were obtained q.8h. x3 which were negative for recent  myocardial infarction.  After the second day, the patient did become more  stable.  His congestive failure was treated more aggressively thereafter.  A  cardiology consultation was obtained with Dr. Sharyn Lull, his cardiologist, as  well.  The patient was continued on IV Lasix as well as ACE therapy and ARB  therapy.  He is continued on antibiotics as well.  He was slowly diuresed as  tolerated.  His BNP gradually decreased from a high of 3200 to near 200 at  the time of discharge.   As the patient's antibiotics were continued, his pulmonary status did  improve.  His appetite returned as well.  No sputum, however, was obtained  for sputum culture.  Serial chest x-rays did demonstrate gradual clearing of  his left upper lobe infiltrate.   Notably at time of admission, he also had evidence for suggestion of a  urinary tract infection by urinalysis.  A urine culture was ordered, but  this was not obtained.  The patient, however, did have  gradual clearing of  urinary symptoms.   There was a mild fluctuation of his blood sugars during his hospital stay,  but this was manageable with his oral medication and sliding-scale as  needed.  He eventually, after stabilization, issues regarding ambulation and  rehabilitation were pursued.  It was noted that the patient still had issues  of deconditioning.  It was recommended by physical therapy that he has  intensive therapy to improve his mobility.  It was noted that the patient  lives at home with his wife and would not be able to assist in mobilize.   By November 15, 2005, the patient was feeling much better.  His mental status  was stable.  He had no respiratory distress.  He did have mild exacerbation  of his renal functions with diuresis, but this did improve at the time of  discharge.  His IV Lasix was discontinued.  By November 16, 2005, he is  feeling much better.  Vital signs are stable.  He is not requiring any  supplemental too.   Chemistry as of today reveals a sodium of 138, potassium 4.7, chloride 102,  CO2 28, BUN 46, creatinine 1.7.  Glucose of 167 this a.m., is nonfasting,  however.  BNP was 466 on oral Lasix.  He will have adjustment of his oral  doses as well.   MEDICATIONS AT TIME OF DISCHARGE:  1. Aricept 10 mg p.o. daily.  2. Avapro 150 mg b.i.d.  3. Ceftin 500 mg b.i.d. for an additional 3 days.  4. Claritin 10 mg p.o. daily.  5. Coreg 12.5 mg p.o. b.i.d.  6. Haldol 0.5 mg p.o. daily at 6 p.m.  7. K-Dur 20 mEq p.o. b.i.d.  8. Lasix 40 mg p.o. b.i.d.  9. Miralax 17 g p.o. daily p.r.n. constipation.  10.Multivitamin 1 daily.  11.Namenda 5 mg p.o. b.i.d.  12.Nitro-Dur patch 0.2 mg/hr daily.  13.Proscar 5 mg p.o. daily.  14.Protonix 40 mg p.o. q.a.m.  15.He will also be on a sliding-scale with NovoLog for a.c., breakfast,      lunch, and dinner only.  For CBGs greater than 350, 9 units; 301-350 7     units; 250-300, 5 units; 201-249, 3 units; 151-200, 2  units.  Should      his blood sugars continue to be mildly elevated, he would be a      candidate for low dose Januvia at 25-50 mg daily.   The patient will be maintained on 4 g sodium,  no concentrated sweets diet.  It is anticipated a 2 week stay at rehab center.  Home therapy thereafter.  The patient should have a repeat CMET and BNP in 1 weeks' time.           ______________________________  Lind Guest August Saucer, M.D.     ELD/MEDQ  D:  11/16/2005  T:  11/16/2005  Job:  [id number]

## 2010-06-17 NOTE — Discharge Summary (Signed)
[name], [name]             ACCOUNT NO.:  [id number]   MEDICAL RECORD NO.:  [id number]          PATIENT TYPE:  INP   LOCATION:  1431                         FACILITY:  [facility]   PHYSICIAN:  Eric L. August Saucer, M.D.     DATE OF BIRTH:  29-Jul-1924   DATE OF ADMISSION:  03/11/2006  DATE OF DISCHARGE:  03/21/2006                               DISCHARGE SUMMARY   FINAL DIAGNOSES:  1. Pneumonia, resolved.  2. Acute-on-chronic congestive heart failure, improved.  3. Severe dilated cardiomyopathy.  4. Valvular heart disease.  5. Hypertension.  6. Diabetes mellitus.  7. Mild renal insufficiency.  8. Alzheimer's disease.  9. History of cerebrovascular accident.  10.Deconditioning.  11.Mild malnutrition.   OPERATIONS AND PROCEDURES:  None.   HISTORY:  This was one of several [facility] admissions for  this 77 year old married black male with a long-standing history of non-  ischemic cardiomyopathy, Alzheimer's disease and diabetes mellitus.  The  patient had been doing only moderately well up until 3 days prior to  admission.  The family at that time noted the gradual onset of  increasing weakness and more sedentary style.  He had decreased  appetite, as well.  It was noted that he had occasional non-productive  cough without associated fever or chills.  He did note notably he felt  more confused, as well.  One night prior to admission, he had increasing  difficulty breathing.  Confusion progressed, as well, and the family  became concerned and brought him to the emergency room for further  evaluation.   In the emergency room, he was noted to have evidence for a new pneumonia  by chest x-ray.  There was also evidence for congestive heart failure,  which had worsened with a BNP over 1500.  The patient was subsequently  admitted for further evaluation and therapy.   PAST MEDICAL HISTORY:  As per admission H&P.   PHYSICAL EXAMINATION:  As per admission H&P.   HOSPITAL  COURSE:  The patient was admitted for further treatment of new  onset of pneumonia.  The was confirmed by bilateral pulmonary  infiltrates, left greater than right.  There was also evidence for  congestive heart failure, as noted.  The patient was placed on  telemetry.  Blood cultures were obtained.  He was started on Rocephin  and Zithromax with oxygen support.   In lieu of his evidence for congestive heart failure, cardiac enzymes  were obtained q.8h. x3, as well, which were not conclusive for acute  injury.  He was seen by Dr. Sharyn Lull of cardiology, who had seen him in  the past for his congestive heart failure.  It was indeed felt that he  had evidence for decompensated congestive failure.  A follow up 2-D  echocardiogram was done, which demonstrated an ejection fraction of  approximately 15%.  Therapy included the addition of ACE inhibitors, as  well as aldosterone.   Over the subsequent days, the patient made a slow but steady  improvement.  He had stabilization of his blood pressure with medication  adjustment.  His BNP did slowly decrease, as  well.   The patient was noted with oral intake to have episodic coughing.  A  speech therapy evaluation was done.  He was noted to have some degree of  aspiration risk.  He subsequently had institution of thickened liquids,  as well as techniques to prevent aspiration.  This was reviewed with the  family, as well.   Throughout his stay, the patient's renal functions were monitored.  His  cardiac medicine was adjusted based on tolerance with his renal  insufficiency, as well.  He also had diabetes mellitus, for which the  blood sugars were monitored closely with diet modification, as well.   By March 20, 2006, he was feeling considerably better.  There was no  resting dyspnea.  He was still noted the have significant mild dyspnea  with exertional activity.  It was felt that the patient would need  further rehabilitation prior to  returning back to his home environment.  This was addressed carefully during his hospital stay, as well.  It was  felt that for the patient to be able to safely return to his home  environment, he would need to be more ambulatory.  Plans, therefore,  were made for the patient to be transferred to [facility] for  further rehabilitation prior to reassessment to see if the patient was  more appropriate to return back to his home environment.  Issues  regarding adequate home care were addressed during the hospital stay.  This will need to be readdressed again prior the patient returning to  his home.   At the time of discharge, the patient was feeling much better.  Denies  chest pains of shortness of breath.  His BUN was 24, creatinine 2.79.  BNP was 277 on March 20, 2006.   MEDICATIONS AT THE TIME OF DISCHARGE:  1. Aldactone 25 mg p.o. daily.  2. Altace 2.5 mg daily.  3. Amaryl 1 mg p.o. daily.  4. Avapro 150 mg p.o. b.i.d.  5. Coreg CR 40 mg p.o. daily.  6. Haldol 0.5 mg p.o. daily at 6 p.m.  7. Lanoxin 0.125 mg daily.  8. Lasix 40 mg p.o. b.i.d.  9. MiraLax 17 gm p.o. daily.  10.Multivitamins one tablet p.o. daily.  11.Namenda 5 mg p.o. b.i.d.  12.Proscar 5 mg p.o. daily.  13.Protonix 40 mg p.o. daily.  14.He will still require Novolin insulin as a sliding scale, a.c.      only, for CBGs from 60-100 zero units, 101-150 one unit, 151-200      two units, 201-250 three units, 251-300 five units, 301-350 seven      units, greater than 350 nine units.   DIET:  The patient will be on a 4 gm sodium, no concentrated sweets,  dysphagia III, chopped with nectar diet.  He should use thickener with  his meals.  Full supervision with oral intake.           ______________________________  Lind Guest. August Saucer, M.D.     ELD/MEDQ  D:  03/21/2006  T:  03/21/2006  Job:  [id number]

## 2010-06-17 NOTE — Cardiovascular Report (Signed)
NAMEFRIEND, DORFMAN             ACCOUNT NO.:  [id number]   MEDICAL RECORD NO.:  [id number]          PATIENT TYPE:  INP   LOCATION:  3305                         FACILITY:  [facility]   PHYSICIAN:  Eduardo Osier. Sharyn Lull, M.D. DATE OF BIRTH:  1924-11-27   DATE OF PROCEDURE:  03/24/2004  DATE OF DISCHARGE:                              CARDIAC CATHETERIZATION   PROCEDURE:  Left cardiac catheterization with selective left and right  coronary angiography, left ventriculography via right groin using Judkins  technique.   INDICATION FOR THE PROCEDURE:  Mr. [name] is a 77 year old black male with  past medical history significant for hypertension, history of Alzheimer's  dementia, silent right basal ganglion lacunar infarct, who was admitted on  March 21, 2004 because of cough and whitish phlegm and associated with  fever and progressive confusion and unsteady gait and dizziness.  The  patient was noted to have facial asymmetry per son, denies any chest pain,  nausea or diaphoresis, denies palpitations, lightheadedness or syncope, also  complains of vague left-sided abdominal pain, no diarrhea or vomiting.  Cardiology consult was called as the patient was noted to have abnormal EKG  and mildly elevated cardiac enzymes.  The patient's son denies any recent  cardiac workup.   PAST MEDICAL HISTORY:  As above.   PAST SURGICAL HISTORY:  He had a cyst resection from the arm and had laser  surgery, both eyes.   ALLERGIES:  No known drug allergies.   MEDICATIONS:  1.  Atenolol 25 mg p.o. daily.  2.  Aricept.  3.  Namenda.   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  No history of smoking or  alcohol abuse.   FAMILY HISTORY:  Noncontributory.   PHYSICAL EXAMINATION:  VITAL SIGNS:  On examination, his blood pressure was  164/76, pulse was 92, regular, with frequent APCs.  HEENT:  Conjunctivae were pink.  NECK:  Supple.  No JVD, no bruits.  LUNGS:  Bibasilar rhonchi and rales  present.  CARDIOVASCULAR EXAM:  S1 and S2 were normal.  There was a soft systolic and  diastolic murmur at left lower sternal border.  ABDOMEN:  Soft.  Bowel sounds were present.  Nontender.  EXTREMITIES:  There was no clubbing, cyanosis or edema.   LABORATORY AND ACCESSORY CLINICAL DATA:  His EKG showed normal sinus rhythm  with left atrial enlargement and ST-T wave changes in the lateral leads  suggestive of inferior ischemia.   His CPK/MB, 2 sets were negative.  Troponin I were slightly elevated 0.06  and 0.1 and 0.21.  Due to EKG changes and mildly elevated troponin I, I  discussed with the patient's family and patient various options of  treatment, i.e., medical versus invasive left cath, possible PTCA and  stenting and its risks, i.e., death, MI, stroke, need for emergency CABG,  risk of restenosis, local vascular complications, etc, and consented for the  procedure.   PROCEDURE:  After obtaining the informed consent, the patient was brought to  the cath lab and was placed on fluoroscopy table. Right groin was prepped  and draped in usual fashion.  Xylocaine 2% was used for local anesthesia in  the right groin.  With the help of a thin-wall needle, a 6-French arterial  sheath was placed.  The sheath was aspirated and flushed.  Next,  a 6-French  left Judkins catheter was advanced over the wire under fluoroscopic guidance  up to the ascending aorta.  Wire was pulled out and the catheter was  aspirated and connected the manifold.  Catheter was further advanced and  engaged into left coronary ostium.  Multiple views of the right and left  system were taken.  Next, the catheter was disengaged and was pulled out  over the wire and was replaced with 6-French right Judkins catheter, which  was advanced over the wire under fluoroscopic guidance into the ascending  aorta.  Wire was pulled out and the catheter was aspirated and connected to  the manifold.  Catheter was further advanced and  engaged into right coronary  ostium.  Multiple views of the right system were taken. Next, the catheter  was disengaged and was pulled out over the wire.  Next, the catheter was  pulled out over the wire was replaced with a 6-French pigtail catheter,  which was advanced over the wire under fluoroscopic guidance up to the  ascending aorta.  Wire was pulled out and the catheter was aspirated and  connected to the manifold.  Catheter was further advanced and advanced  across the aortic valve into the LV.  LV pressures were recorded.  Next,  left ventriculography was done in a 30-degrees RAO position.  Post-  angiographic pressures were recorded from LV and then pullback pressures  were recorded from the aorta.  There was no significant gradient across the  aortic valve.  Next, the pigtail catheter was pulled out over the wire,  sheaths aspirated and flushed.   FINDINGS:  LV showed severe global hypokinesia, EF of 20-25%.  Left main was  patent.  LAD has 10% to 15% mid-stenosis.  Diagonal 1 was very small.  Diagonal 2 was small, which was patent.  Left circumflex was small, which  was patent.  OM-1 and OM-2 was very small.  Ramus was moderate-sized, which  was patent.  RCA was patent, which was a dominant vessel.  The patient had  episode of nonsustained VT, 4-6 beats, prior to starting the procedure; the  patient was asymptomatic.  The patient did not have any episodes of VT  during the procedure.  We will get EP consult for possible evaluation of EP  study and possible ICD, as the patient has severe LV dysfunction and mild  heart failure and nonsustained VT.  The patient was transferred to the  recovery room in stable condition.      MNH/MEDQ  D:  03/24/2004  T:  03/25/2004  Job:  [id number]   cc:   [facility]   Minerva Areola L. August Saucer, M.D.  [address] [id number]  [place]  [place] [address]  Fax: [phone]

## 2010-06-17 NOTE — Op Note (Signed)
[name], [name]             ACCOUNT NO.:  [id number]   MEDICAL RECORD NO.:  [id number]          PATIENT TYPE:  AMB   LOCATION:  ENDO                         FACILITY:  [facility]   PHYSICIAN:  James L. Malon Kindle., M.D.DATE OF BIRTH:  [birth year]   DATE OF PROCEDURE:  03/01/2004  DATE OF DISCHARGE:                                 OPERATIVE REPORT   PROCEDURE PERFORMED:  Colonoscopy with polypectomy.   ENDOSCOPIST:  Llana Aliment. Edwards, M.D.   MEDICATIONS:  Fentanyl 75 mcg, Versed 5 mg IV for both procedures.   INDICATIONS FOR PROCEDURE:  Failure to thrive, weight loss.   DESCRIPTION OF PROCEDURE:  The procedure had been explained to the patient  and consent obtained.  With the patient in the left lateral decubitus  position following the endoscopy, the pediatric adjustable colonoscope was  inserted and advanced.  We advanced easily to the cecum, using abdominal  pressure and position changes.  The scope was withdrawn and the cecum,  ascending colon, transverse colon, splenic flexure, descending and sigmoid  colon were seen well.  In the mid sigmoid colon a 1 cm polyp was removed  with the snare, sucked up against the scope, recovered.  The scope was  reinserted back to the polypectomy site.  The scope was withdrawn and no  other polyps were seen in the sigmoid colon and rectum.  The patient  tolerated the procedure well.   ASSESSMENT:  Sigmoid colon polyp removed.  Doubt this was the source of his  weight loss, probably is due to dementia.   PLAN:  Will check pathology report, routine post polypectomy instructions.  Will see back into the office in two months.      JLE/MEDQ  D:  03/01/2004  T:  03/01/2004  Job:  [id number]   cc:   Minerva Areola L. August Saucer, M.D.  [address] [id number]  [place]  [place] [address]  Fax: [phone]

## 2010-06-17 NOTE — H&P (Signed)
[name], [name]             ACCOUNT NO.:  [id number]   MEDICAL RECORD NO.:  [id number]          PATIENT TYPE:  INP   LOCATION:  4738                         FACILITY:  [facility]   PHYSICIAN:  Eric L. August Saucer, M.D.     DATE OF BIRTH:  03-05-1924   DATE OF ADMISSION:  09/05/2005  DATE OF DISCHARGE:                                HISTORY & PHYSICAL   CHIEF COMPLAINT:  Progressive weakness, confusion, congestive heart failure.   HISTORY OF PRESENT ILLNESS:  This is one of several [facility]  admission for this 77 year old married black male with a history of  Alzheimer's disease, previously documented non-ischemic cardiomyopathy, and  congestive heart failure.  The patient has had a progressive decline over  the past few weeks with increasing weakness, shortness of breath, and  decreasing activity.  The patient's son reports appetite has been good.  There has been varying reports of whether the patient has been taking his  medications consistently or not and multiple family members have  participated in his care.  The family has, however, become increasingly  concerned that the patient has gotten increasingly weaker with decreased  activity.  Follow up laboratory date showed a BNP greater than 1200 at this  time.  He is subsequently admitted for further stabilization of congestive  heart failure.  The patient, himself, remains confused.  He denies  headaches, chest pain, palpitations, or shortness of breath.  It has been  noted that his gait has been unsteady.  He has had periods of being  extremely weak one day and only moderately weak on others.  There has been  no localized focal weakness seen.  No seizure activity, as well.   PAST MEDICAL HISTORY:  Notable for long standing hypertension.  He most  recently has been diagnosed with diabetes mellitus over the past year.  History of prostate CA followed by Dr. Brunilda [name], as well.  Alzheimer's disease  as well.  The patient had  undergone a cardiac catheterization in October  2006 per Dr. Algie Coffer.  He was noted to have only minimal disease in the LAD.  He had an ejection fraction of 20-25%.  Past medical history, otherwise, is  well documented in the old chart.  There is an ongoing concern regarding  medication compliance.   SOCIAL HISTORY:  The patient is married.  He has three adult children alive  and well, all very supportive.  He has a supportive wife, as well.  The  patient is a retired [employer].  The patient does not smoke or drink.  He does have a history of second hand smoke exposure for greater than 30  years.   CURRENT MEDICATIONS:  Spironolactone 25 mg daily, Coreg CR 40 mg daily,  Aricept 10 mg q.h.s., Atacand 60 mg b.i.d., Proscar 5 mg daily, Megace 40 mg  b.i.d., Amaryl 2 mg daily, Lasix 20 mg daily, Fexofenadine 180 mg daily.   PHYSICAL EXAMINATION:  GENERAL:  He is a slender black male in no acute distress.  VITAL SIGNS:  Weight 151 pounds.  Height 5 feet 11 inches.  Blood  pressure  149/74, pulse 84, respiratory rate 20, temperature 98.6.  Random glucose  116.  HEENT:  Head normocephalic, atraumatic.  Extraocular movements intact.  No  scleral icterus.  No sinus tenderness appreciated.  TMs were clear.  Throat  and posterior pharynx clear.  NECK:  No enlarged thyroid.  No carotid bruits appreciated.  LUNGS:  A few basilar crackles at bases.  No wheezes or significant rales  appreciated at this time.  No E to A changes.  CARDIOVASCULAR EXAM:  Normal S1 and S2.  Intermittent soft S3 noted.  No JVD  at 90 degrees at this time.  ABDOMEN:  Bowel sounds are present, no masses or tenderness.  EXTREMITIES:  Notably without edema.  Negative Homan's.  1+ dorsalis pedis  pulses.  NEUROLOGICAL:  He is alert and oriented to person, negative to place or  time.  Pleasant in his conversation.  Poor short term memory.  Long term  memory better than short.  The patient moves all extremities.   Decreased  rapid alternating movements bilaterally.  Slow to follow instructions, as  well.  SKIN:  Without lesions.   LABORATORY DATA:  CBC with WBC 4300, hemoglobin 15.1, hematocrit 39.6,  platelets 226,000.  Chemistry with sodium 141, potassium 3.8, chloride 110,  CO2 23, BUN 20, creatinine 1.6, glucose 124.  Bilirubin 1.1, SGOT and SGPT  within normal limits, albumin 3.8, total protein 7, calcium 9.7, magnesium  2.4.  CK MB 3.7, CK 94, troponin 0.34, slightly elevated.  EKG shows normal  sinus rhythm with left axis deviation, left anterior fascicular block, poor  R wave progression, T wave inversions in the lateral leads.  Chest x-ray is  pending.   IMPRESSION:  1.  Congestive heart failure, progressive.  2.  Non-ischemic cardiomyopathy possibly secondary to hypertensive disease.  3.  Alzheimer's disease, progressive.  4.  Rule out coronary artery disease.  5.  Prostate CA on Proscar therapy.  6.  Deconditioning, multifactorial.  7.  Diabetes mellitus questionable control.  8.  History of medication noncompliance of questionable severity.   PLAN:  The patient is admitted to telemetry at this time.  Will exclude  recent injury with cardiac enzymes.  Start IV Lasix.  Continue his ARBs in  the form of Atacand.  Will start nitrates orally initially.  Will obtain  cardiology evaluation.  Follow up 2D echo, as well. Obtain physical therapy  consult to assess the patient's mobility and conditioning status.  Further  therapy pending response to the above.           ______________________________  Lind Guest. August Saucer, M.D.     ELD/MEDQ  D:  09/05/2005  T:  09/05/2005  Job:  [id number]

## 2010-06-17 NOTE — Discharge Summary (Signed)
NAMESMARAN, [name]             ACCOUNT NO.:  [id number]   MEDICAL RECORD NO.:  [id number]          PATIENT TYPE:  INP   LOCATION:  3736                         FACILITY:  [facility]   PHYSICIAN:  Jack [name], M.D.     DATE OF BIRTH:  Jan 10, 1925   DATE OF ADMISSION:  03/21/2004  DATE OF DISCHARGE:  03/28/2004                                 DISCHARGE SUMMARY   FINAL DIAGNOSES:  1. Congestive heart failure.  428.0.  2. Primary cardiomyopathy.  425.4.  3. Paroxysmal ventricular tachycardia.  427.1.  4. Gastrointestinal hemorrhage.  578.9.  5. Alzheimer's disease.  331.0.  6. Dementia.  294.10.  7. Hypertension.  401.9.  8. Diabetes mellitus type 2.  250.00.  9. Anemia.  285.9.  10.Hyperlipidemia.  272.4.  11.Renal ureteral disease.  593.19.  12.Coronary atherosclerosis with native coronary artery vessels.  414.04.     OPERATION/PROCEDURE:  1. Left heart cardiac catheterization per Dr. Sharyn [name].  2. Anaerobe nutrition.  3. Coronary arteriogram.  4. Left heart angiocardiogram.     HISTORY OF PRESENT ILLNESS:  This is a second recent [facility]  admission for this 77 year old married black male with now [name] 18-hour  history of increasing confusion.  The patient has a history of Alzheimer's  disease, hypertension, BPH with recent weight loss.  For the past two days  prior to admission, he was noted to have increasing nonproductive cough with  decreasing activity.  On the morning of admission, he was more confused with  progressive weakness with unsteady gait.  The family became concerned for a  stroke.  A CT scan was obtained, which was negative for acute stroke.  He  was sent to the ER after developing [name] acute fever.  The patient subsequent  admitted for further evaluation.   PAST MEDICAL HISTORY:  As per admission H&P.   PHYSICAL EXAMINATION:  As per admission H&P.   HOSPITAL COURSE:  Patient was admitted for further evaluation for new onset  of fever with  questionable sepsis.  He had mental status changes which were  felt to be secondary to sepsis versus a fever as well.  Notably, at the time  of admission, he had abnormal EKG as well as electrolyte abnormalities as  well.  The patient was admitted to [name] ICU setting.  Cardiac enzymes were  obtained q.8h. x3.  He did have a mild increase in his troponin and CPK as  well.  In lieu of fever and early question of infection, he was placed on  Avelox  400 mg IV daily.  Blood and urine cultures were obtained.  Because  of his abnormal tracing, the patient was seen by Dr. Sharyn [name] of cardiology.  It is felt that he most likely had a non-Q-wave MI.  A BNP was obtained as a  screening.  He was noted to have [name] elevated BNP at 262 as well.  A 2D echo  demonstrated evidence for LV dysfunction.  The patient was thereafter  treated for a somewhat atypical presentation for congestive heart failure.  His medications were adjusted per cardiology recommendations as well.  Notably, his urine cultures and blood cultures initially remained negative.  His fever, however, did gradually defervesce on IV antibiotics.   In lieu of the patient's abnormal LV function and small non-Q-wave MI,  further evaluation of this was pursued.  He did undergo cardiac  catheterization per Dr. Sharyn [name] on March 24, 2004, which he tolerated  well.  This did demonstrate severe global hypokinesis with [name] ejection  fraction of 20-25%.  The LM was patent.  The LAD showed 10-15% minimal  disease.  The RCA was patent as well.   Further medical management was pursued.  Because of evidence for congestive  failure and a history of syncope, the question of ICD was addressed.  He is  further evaluated by [facility] for indications for ICD.  He notably  at the time was also on Avelox , which was discontinued due to the risk for  prolonging Q-T interval.  After much review of the patient's symptoms and  presentation as well as risk  factors, it is felt that maximum medication  management will be most prudent at this time, given the patient's ongoing  medical problems.  This was reviewed with the family, and they were in  agreement.   Over the subsequent days, the patient did make gradual improvement.  He  ambulated, which he tolerated well.  His medications were maximized as well.   By February 27, the patient was doing well and felt to be stable for  discharge.  He will need ongoing followup as [name] outpatient for his LP  functions, BNP, as well as oral intake.   DISCHARGE MEDICATIONS:  1. Altace 2.5 mg p.o. b.i.d.  2. Coreg 6.25 mg b.i.d.  3. Aspirin 81 mg daily.  4. Aricept 10 mg daily.  5. Nitroglycerin patch 0.2 mg per hour daily.  6. Haldol 1 mg 1/2 tablet daily.  7. Ferrous gluconate 325 mg daily.  8. __________ 200 mg daily x5 days.   He is to remain on a 4 gram sodium, caffeine-free diet.  He is to be seen in  the office in  two weeks time for followup.      ELD/MEDQ  D:  05/18/2004  T:  05/18/2004  Job:  [id number]

## 2010-06-17 NOTE — Discharge Summary (Signed)
[name], [name]             ACCOUNT NO.:  [id number]   MEDICAL RECORD NO.:  [id number]          PATIENT TYPE:  INP   LOCATION:  6709                         FACILITY:  [facility]   PHYSICIAN:  Eric L. August Saucer, M.D.     DATE OF BIRTH:  1924-10-02   DATE OF ADMISSION:  04/18/2006  DATE OF DISCHARGE:  04/30/2006                               DISCHARGE SUMMARY   FINAL DIAGNOSIS:  1. Pulmonary embolus.  2. Syncope secondary to #1.  3. Congestive heart failure, compensated.  4. Alzheimer's disease.  5. Nonischemic cardiomyopathy.  6. Diabetes mellitus, stable.  7. Deconditioning.  8. History of benign prostatic hypertrophy.  9. Mild malnutrition, improving.  10.Dysphasia, chronic.   OPERATIONS/PROCEDURES:  Ventilation perfusion study.   HISTORY OF PRESENT ILLNESS:  [name] hospital admissions for this 77-  year-old married black male with a history of congestive heart failure,  dementia with diabetes mellitus, and renal insufficiency.  He had been  doing fairly well at the local rehab center, [facility], until the  day of admission.  The patient was participating in physical therapy,  when, while walking, he suddenly became unresponsive.  He subsequently  was evaluated by EMS.  Heart rates were noted to be in the 40s and 50s  at the time.  It was estimated that he was unresponsive for five to 20  minutes.  The patient was brought to the emergency room.  He was found  to be alert and oriented.  He subsequently was admitted for  further  evaluation and therapy.  Notably, further evaluation in the emergency  room did consist of a VQ scan as his D-dimer was elevated.  This was  found to be probable for a pulmonary embolus in the left lower lobe  region.   HOSPITAL COURSE:  The patient was admitted for further treatment  of his  pulmonary embolus in syncopal spells.  He was placed on telemetry.  He  was started on Lovenox for PE protocol.  He was subsequently, the next  day,  started on Coumadin.  He was seen by cardiology and initially it  was unclear as to the etiology of this syncopal spell  He was noted to  be well compensated from his congestive heart failure, standpoint.   The patient was maintained on his other cardiac medicines during that  time.  On the subsequent days, he was maintained on Lovenox as his INR  gradually progress towards a therapeutic range.  The patient's blood  sugars were monitored as well during this time with adjustment of his  diabetic medications.   Notably over the subsequent days that is INR was fairly resistant to  increase.  His Coumadin dosing was managed by pharmacy with gradual  increasing doses, if necessary, to maintain a therapeutic INR.  Further  screenings, including his Digoxin level was found to be subtherapeutic,  and medication was adjusted.  A urine culture was obtained also which  showed a small colony count of vancomycin resistant enterococci.  This  was felt to be secondary to colonization.  He had initially been on  Rocephin  which was stopped is no other evidence for urinary tract  infection was found.   Over the subsequent days.  The patient did make steady improvement.  His  appetite did improve.  His blood sugars did remain stable.  He was  gradually made more ambulatory from sedentary to bed-to-chair activity,  which he tolerated well.  It was felt, however, that would need further  rehabilitation prior to going back home.  Eventually his INR did become  therapeutic.  Arrangements are subsequently been made comments this  time, the patient to be transferred to [facility] for further  rehabilitation with goals toward eventual return to home.  The patient  at the time of discharge is feeling well.  INR was at 2.6.  We will have  the INR checked at the nursing home as well.  The patient is presently  stable for discharge.   MEDICATIONS AT THE TIME OF DISCHARGE:  1. Lanoxin 0.125 mg daily.  2.  Furosemide 40 mg p.o. b.i.d.  3. Spironolactone 25 mg daily.  4. Avapro 150 mg b.i.d.  5. Carvedilol.  6. Coreg CR 40 mg daily.  7. Haldol 0.5 mg daily at 6 p.m..  8. Multivitamin with mineral supplement one daily.  9. Namenda 5 mg b.i.d.  10.Proscar 5 mg daily.  11.Protonix 40 mg daily.  12.MiraLax 17 gm in 8 ounces of water daily for constipation.  13.Coumadin 7.5 mg daily.  14.He will have sliding scale insulin available for a.c. only:  For      CBGs from 151 to 200 three units, 201-250 five units, 251 to 300      seven units, 301 to 350 nine units, and greater than 350 eleven      units.   DISCHARGE INSTRUCTIONS:  1. The patient will need to have is INR rechecked in two days' time on      his dose of 7.5 mg with further adjustments if necessary.  2. He will be maintained on a dysphasia III diet with thickened      supplements.  3. Physical therapy as tolerated.  4. He will still need CBGs done a.c.   ADDENDUM:  He will also need his Amaryl at 1 mg p.o. daily as before.           ______________________________  Lind Guest. August Saucer, M.D.     ELD/MEDQ  D:  04/30/2006  T:  04/30/2006  Job:  [id number]

## 2010-06-17 NOTE — Cardiovascular Report (Signed)
NAMEQUINTEZ, [name]             ACCOUNT NO.:  [id number]   MEDICAL RECORD NO.:  [id number]          PATIENT TYPE:  OUT   LOCATION:  CATH                         FACILITY:  [facility]   PHYSICIAN:  Ricki Rodriguez, M.D.  DATE OF BIRTH:  Mar 06, 1924   DATE OF PROCEDURE:  11/10/2004  DATE OF DISCHARGE:                              CARDIAC CATHETERIZATION   PROCEDURE:  Left heart catheterization, selective coronary angiography, left  ventricular function study.   INDICATIONS:  This 77 year old black male had a  two day history of chest  pain, abnormal cardiac enzymes, abnormal EKG, diabetes mellitus,  hypertension, and a nonsustained ventricular tachycardia.   APPROACH:  Right femoral artery, using a 4 French sheath and catheters.   COMPLICATIONS:  None.   Less than 50 cc of dye was used.   HEMODYNAMIC DATA:  1.  Left ventricular pressure:  The left ventricular pressure was 119 x 22,      and the aortic pressure was 123 x 77.  2.  Left ventriculogram:  The left ventriculogram showed 15-20% ejection      fraction with a dilated left ventricle and severe generalized      hypokinesia.  3.  Coronary anatomy:  The left main coronary artery was unremarkable.  4.  Left anterior descending coronary artery:  The left anterior descending      coronary artery had 10-20% mid LAD concentric stenosis, otherwise was      unremarkable.  Diagonal 1 was relatively smaller than diagonal 2 vessel      but was unremarkable.  5.  Left circumflex coronary artery:  The left circumflex coronary artery      was a relatively small vessel with a decreased narrowing of the obtuse      marginal branch and had a larger ramus branch.  6.  Right coronary artery:  The right coronary artery had proximal      irregularities and posterolateral and posterior descending coronary      artery were unremarkable.   IMPRESSION:  1.  Dilated cardiomyopathy with severe left systolic dysfunction.  2.  Minimal multi-vessel  coronary artery disease.   RECOMMENDATIONS:  This patient will be treated with medical therapy and will  continue amiodarone.  We will also get electrophysiology consult to see if  the patient is a candidate for ICD placement.  Overall prognosis remains  poor.  The patient may be a nursing home candidate.      Ricki Rodriguez, M.D.  Electronically Signed    ASK/MEDQ  D:  11/10/2004  T:  11/10/2004  Job:  [id number]

## 2010-06-17 NOTE — Discharge Summary (Signed)
[name], [name]             ACCOUNT NO.:  [id number]   MEDICAL RECORD NO.:  [id number]          PATIENT TYPE:  INP   LOCATION:  1407                         FACILITY:  [facility]   PHYSICIAN:  Eric L. August Saucer, M.D.     DATE OF BIRTH:  02/18/1924   DATE OF ADMISSION:  01/05/2006  DATE OF DISCHARGE:  01/12/2006                               DISCHARGE SUMMARY   FINAL DIAGNOSIS:  1. Bilateral pneumonia.  2. Acute on chronic congestive heart failure, non-ischemic.  3. Hypertension, stable.  4. Dementia.  5. Diabetes mellitus.  6. History of prostatic carcinoma.  7. Mild renal insufficiency.   OPERATIONS AND PROCEDURES:  None.   HISTORY OF PRESENT ILLNESS:  This is one of several [facility]  admissions for this 77 year old married black male with a past medical  history significant for non-ischemic cardiomyopathy, history of  congestive heart failure, hypertension, diabetes mellitus, Alzheimer's  disease with mild renal insufficiency.  The patient came to the  emergency room via EMS complaining of cough with increasing shortness of  breath, progressive weakness since the morning of admission.  The  patient, himself, denied fever, but the son states the patient had been  experiencing chills with decreasing appetite over the past 24 hours  prior to admission.  There was also increasing difficulty walking with  generalized weakness and shortness of breath.  The patient had  previously had a history of left upper lobe pneumonia which had  resolved.  He had been doing well up until within the 24 hours of  admission.  The patient was evaluated in the emergency room.  He was  noted to have pneumonia and was subsequently admitted to the hospital  thereafter.   Past medical history and physical exam as per admission H&P.   HOSPITAL COURSE:  The patient was admitted to the hospital per Dr.  Sharyn Lull of cardiology.  At the time of admission, he had a blood  pressure of 147/95, pulse  rate of 117, and a respiratory rate of 20.  The patient was placed on telemetry.  Blood cultures were obtained.  He  was placed on supplemental O2.  He was started on IV antibiotics for his  pneumonia, as well.  Cardiac enzymes were obtained which were negative  for signs of acute injury.  Over the subsequent days, the patient made  slow but steady improvement.  Follow up chest x-ray showed gradual  clearing of his right lung infiltrate.  No sputum cultures confirming  the pneumonia were obtained.  The blood cultures remained negative.  Eventually with supportive care, he made steady progress.  His O2  supplementation was gradually weaned, as well.  The patient notably  during his hospital stay experienced ongoing mild confusion.  There was  no problem with significant agitation, however.   The patient's pneumonia did gradually resolve.  On telemetry, we had  noted intermittent problems with non-sustained V-tach.  His follow up  electrolyte studies were significantly unremarkable.  He had a mild  creatinine of 1.9 on January 09, 2006.  A BNP, however, was noted to be  2397.  Follow up chest x-ray demonstrated improved bilateral air space  disease.  He, however, was noted to have mild hypotension at that time.  His cardiac medications were adjusted.  He did obtain follow up with Dr.  Sharyn Lull of cardiology, as well.  His Coreg medication was decreased.  He  was subsequently started on low dose digoxin and Lasix, as well.  On  this regimen, he continued to improve thereafter.  His appetite  gradually improved, as well.  It was recognized, however, that he had  significant deconditioning which would require further rehabilitation.  It had been noted that he had done this on his previous admissions to  the hospital which was found to be beneficial to him, returning home and  being able to be managed by his family.   By January 12, 2006, the patient was felt to be stable for transfer to   [facility].  He will continue rehabilitative services, there.   DISCHARGE MEDICATIONS:  Amaryl 1 mg p.o. daily, Claritin 10 mg daily,  multi-vitamins one daily, MiraLax 17 grams p.o. daily, KCL 20 mEq daily,  Lasix 40 mg daily, Proscar 5 mg daily, Namenda 5 mg b.i.d., Coreg 12.5  mg p.o. b.i.d., Atacand 16 mg b.i.d., Aricept 10 mg p.o. daily, Haldol  0.5 mg daily at 6 p.m., digoxin 0.125 mg daily, Protonix 40 mg p.o.  q.a.m.  He will also continue on Vantin 200 mg p.o. daily for an  additional five days, Zithromax 250 mg daily for three days.   DISCHARGE INSTRUCTIONS:  The patient will be maintained on a 4 gram  sodium, no concentrated sweets diet.  He will still need to have his  CBGs checked a.c.  Further therapy pending physical therapy and  occupational therapy.           ______________________________  Lind Guest. August Saucer, M.D.     ELD/MEDQ  D:  01/12/2006  T:  01/12/2006  Job:  [id number]

## 2010-06-17 NOTE — Procedures (Signed)
EEG NUMBER:  [id number].   CLINICAL INFORMATION:  This patient is a 77 year old black male with  Alzheimer's disease.  No medications are listed.  There is very limited  history.   DIAGNOSTICS:  This EEG was recorded in the awake state.  The background  activity shows low-voltage fast beta activity and evidence of alpha  activity and evidence of theta activity in the background.  There is no  focal asymmetry.  Movement artifact is frequently seen.  Photic  stimulation was performed which did not produce a driving response.  Hyperventilation testing was not performed.   IMPRESSION:  This is an abnormal EEG showing evidence of diffuse slowing  without focal abnormality present.  No epileptiform activity or stage II  sleep is seen.           ______________________________  Genene Churn. Sandria Manly, M.D.     ZOX:WRUE  D:  05/29/2006 15:17:31  T:  05/29/2006 17:09:28  Job #:  [id number]

## 2010-06-17 NOTE — Discharge Summary (Signed)
[name], [name]             ACCOUNT NO.:  [id number]   MEDICAL RECORD NO.:  [id number]          PATIENT TYPE:  INP   LOCATION:  1417                         FACILITY:  [facility]   PHYSICIAN:  Eric L. August Saucer, M.D.     DATE OF BIRTH:  [birth year]   DATE OF ADMISSION:  10/31/2004  DATE OF DISCHARGE:  11/24/2004                                 DISCHARGE SUMMARY   FINAL DIAGNOSES:  1.  Chronic systolic heart failure, 428.22.  2.  Primary cardiomyopathy, 425.4.  3.  Pleural effusions, 511.9.  4.  Protein caloric malnutrition, 263.9.  5.  Congestive heart failure, 428.0.  6.  Coronary atherosclerosis of native coronary vessel, 414.01.  7.  Alzheimer's disease, 331.0.  8.  Hypertension, 401.9.  9.  Diabetes mellitus type 2, 250.00.  10. Malignant neoplasm of the prostate, 185.  11. Adult failure to thrive, 783.7.  12. 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   OPERATION/PROCEDURE:  Left heart cardiac catheterization per Dr. Algie Coffer on  November 10, 2004.   HISTORY OF PRESENT ILLNESS:  This was one of several [place] hospital  admissions for this 77 year old, married, black male with a longstanding  history of hypertension, progressive Alzheimer's disease with a fairly  recent history of nonischemic cardiomyopathy. The patient had not been doing  well over the past few weeks prior to admission. The family had noted him  becoming increasingly weaker with bouts of confusion. This was associated  with increasing periods of shortness of breath with nocturnal dyspnea as  well. The patient had experienced intermittent nonproductive cough. The  patient himself denied any chest pains, palpitations or abdominal  complaints. His appetite had been variable though overall decreased. There  had been some increasing lower extremity edema as well. As the patient had  noted progressive confusion without improvement overall, the patient was  seen in the hospital and was noted to be in worsening failure. He was  noted  to have 7 pounds from his last office visit and was subsequently admitted  for further treatment of congestive heart failure.   PAST MEDICAL HISTORY AND PHYSICAL EXAM:  Per admission H&P.   HOSPITAL COURSE:  The patient was admitted for further treatment of  progressive congestive heart failure. Notably at the time of admission, he  had a BNP of 2538. His hemoglobin at the time of admission was 13.9,  hematocrit 42.7, potassium 3.5. The patient was admitted to telemetry.  Cardiac enzymes were obtained q.8 h x3 which was negative for acute injury.  Due to his severe congestive failure, he was seen on consultation by Eduardo Osier. Sharyn Lull, MD of cardiology as well. The patient was started on diuresis  with strict I&Os. His Altace which he had been taking on a regular basis was  stopped and he was started on an ARB. The patient was noted also to have  significant dementia and was placed on Aricept plus Namenda for control.   On the subsequent days, the patient had a very slow but gradual diuresis  with improvement of his heart failure. This did require adjustments of his  cardiac medicines on  an ongoing basis. He was also noted to have mild renal  insufficiency which limited the degree of aggressive diuresis to be pursued.  His chest x-ray did demonstrate evidence for cardiomegaly with left pleural  effusion. No actual evidence for infection was noted. Over the subsequent  days, he had a very gradual decrease in his BNP with subsequent drop in  weight as well. His appetite was very slow to improve but did eventually  improve with the institution of Megace.   Due to his significant heart failure, the patient was seen in consultation  by Dr. Graciela Husbands regarding ICD and placement. After consideration of the  patient's ongoing dementia and prostate CA and multiple medical problems, it  was felt that this was not an appropriate device at this time. This was  discussed with family as well.    Over the subsequent days, the patient made a steady improvement. He was  noted to have diabetes which was new in onset. He was started on medications  for this in the form of Amaryl. He had one episode of hypoglycemia with  appropriate adjustments in medicines thereafter. Notably his appetite did  gradually progress.   On October 11, the patient experienced an episode of ventricular  arrhythmias. He was evaluated by Dr. Algie Coffer who was covering for Dr.  Sharyn Lull. It was subsequently recommended the patient undergo another cardiac  catheterization to exclude progression of disease. This was done on October  12. He was noted to have persistence of his dilated cardiomyopathy with only  minimal coronary artery disease. It was felt that he still had significant  nonischemic cardiomyopathy with an ejection fraction estimated at that time  of approximately 15%. Continued medical management was pursued.   Notably while hospitalized, the patient did attempt to go to the restroom  one night and slipped and injured his right hip. He sustained a large  hematoma on the right hip. x-rays confirmed this without evidence for  fracture. This, however, did limit his mobility initially. An orthopedic  evaluation was obtained. It was recommended the patient undergo physical  therapy with pain control. Eventually with ongoing therapy, the patient was  made more ambulatory and tolerated this well.   With continued physical therapy, the patient became stronger. He did have a  mild increase in his BNP which had dropped to as low as 264. Further  adjustment of his medicine, he had a further decline to 246. He had good  blood pressure with a range of 117/66 without significant orthostasis. By  October 26, he was felt to be stable for discharge. Weight at that time was  161. His lungs were clear with no evidence for edema. The patient was ambulatory with a rolling walker. It was recognized that he had multiple   medical problems but these could be followed up on as an outpatient at this  time.   At the time of discharge, medications consisted of:   1.  Aricept 10 mg daily.  2.  Protonix 40 mg daily.  3.  Proscar 5 mg daily.  4.  Fexofenadine 180 mg daily.  5.  Atacand 15 mg b.i.d.  6.  Aldactone 25 mg daily.  7.  Haldol 1 mg daily at 7 p.m.  8.  Megace 2 teaspoons b.i.d.  9.  Aspirin 325 mg daily.  10. Lasix 20 mg daily.  11. Nitro-Dur 0.2 mg per hour patch daily.  12. Coreg 6.25 mg b.i.d.  13. He will be maintained on Colace at  100 mg b.i.d.  14. Fergon 325 mg daily.  15. Darvocet-N 100 q.6 h p.r.n. severe pain.  16. Glucerna 1 can in between meals.   He will be maintained on a 4 gram sodium carbohydrate modified diet. He will  be followed up by [facility] as an outpatient. He will be seen with  the congestive heart failure protocol. He is to be seen in the office in 2  weeks time for followup.           ______________________________  Lind Guest. August Saucer, M.D.     ELD/MEDQ  D:  01/11/2005  T:  01/12/2005  Job:  [id number]

## 2010-06-17 NOTE — Op Note (Signed)
NAMEHENDRIX, [name]             ACCOUNT NO.:  [id number]   MEDICAL RECORD NO.:  [id number]          PATIENT TYPE:  AMB   LOCATION:  ENDO                         FACILITY:  [facility]   PHYSICIAN:  James L. Malon Kindle., M.D.DATE OF BIRTH:  August 31, 1924   DATE OF PROCEDURE:  03/01/2004  DATE OF DISCHARGE:                                 OPERATIVE REPORT   PROCEDURE:  Esophagogastroduodenoscopy.   MEDICATIONS:  1.  Cetacaine spray.  2.  Fentanyl 75 mcg.  3.  Versed 5 mg IV.   INDICATIONS:  Failure to thrive and weight loss.   DESCRIPTION OF PROCEDURE:  After the procedure had been explained to the  patient and consent obtained, with the patient in the left lateral decubitus  position the Olympus scope was inserted and advanced.  The stomach was  entered, __________identified and passed.  The duodenum including the bulb  and second portion were seen well and were unremarkable.  The scope was  withdrawn back into the stomach.  The __________, antrum and body were  normal.  Fundus and cardia were seen well on the retroflexed view and were  normal.  The distal esophagus was quite reddened, and there were several  areas of inflammation and erosions consistent with ulcerative esophagitis.  The GE junction was widely patent.  The proximal esophagus was normal.  The  scope was withdrawn.  The patient tolerated the procedure well.   ASSESSMENT:  Esophageal reflux, 530.11.  Doubt this is the source of all of  his symptoms.   PLAN:  Will give Prilosec OTC and proceed with a colonoscopy at this time as  planned.      JLE/MEDQ  D:  03/01/2004  T:  03/01/2004  Job:  [id number]   cc:   Minerva Areola L. August Saucer, M.D.  [address] [address]  [place]  [place] [address]  Fax: [phone]   Llana Aliment. Malon Kindle., M.D.  [address], [address]  [place]  [place] [address]  Fax: [phone]

## 2010-10-28 LAB — COMPREHENSIVE METABOLIC PANEL
AST: 16
Albumin: 3.4 — ABNORMAL LOW
Albumin: 3.5
BUN: 14
BUN: 14
Calcium: 9.1
Chloride: 107
Creatinine, Ser: 1.61 — ABNORMAL HIGH
Creatinine, Ser: 1.62 — ABNORMAL HIGH
GFR calc Af Amer: 50 — ABNORMAL LOW
GFR calc non Af Amer: 41 — ABNORMAL LOW
Total Bilirubin: 0.8
Total Protein: 6.5

## 2010-10-28 LAB — COMPREHENSIVE METABOLIC PANEL WITH GFR
ALT: 10
ALT: 10
ALT: 8
AST: 16
AST: 16
AST: 17
Albumin: 3.4 — ABNORMAL LOW
Albumin: 3.6
Albumin: 3.8
Alkaline Phosphatase: 50
Alkaline Phosphatase: 50
Alkaline Phosphatase: 59
BUN: 13
BUN: 13
BUN: 15
CO2: 26
CO2: 26
CO2: 27
Calcium: 9.1
Calcium: 9.4
Calcium: 9.6
Chloride: 106
Chloride: 107
Chloride: 107
Creatinine, Ser: 1.52 — ABNORMAL HIGH
Creatinine, Ser: 1.63 — ABNORMAL HIGH
Creatinine, Ser: 1.76 — ABNORMAL HIGH
GFR calc non Af Amer: 37 — ABNORMAL LOW
GFR calc non Af Amer: 41 — ABNORMAL LOW
GFR calc non Af Amer: 44 — ABNORMAL LOW
Glucose, Bld: 118 — ABNORMAL HIGH
Glucose, Bld: 124 — ABNORMAL HIGH
Glucose, Bld: 98
Potassium: 3.6
Potassium: 4.2
Potassium: 4.4
Sodium: 141
Sodium: 141
Sodium: 141
Total Bilirubin: 0.6
Total Bilirubin: 0.6
Total Bilirubin: 0.9
Total Protein: 6.2
Total Protein: 6.5
Total Protein: 7.1

## 2010-10-28 LAB — CBC
HCT: 36.6 — ABNORMAL LOW
HCT: 39.5
Hemoglobin: 12 — ABNORMAL LOW
Hemoglobin: 13.1
MCHC: 32.7
MCHC: 33.2
MCV: 88.3
MCV: 89.6
Platelets: 191
Platelets: 198
RBC: 4.08 — ABNORMAL LOW
RBC: 4.48
RDW: 14.5
RDW: 14.5
WBC: 4.5
WBC: 4.6

## 2010-10-28 LAB — GLUCOSE, CAPILLARY
Glucose-Capillary: 102 — ABNORMAL HIGH
Glucose-Capillary: 107 — ABNORMAL HIGH
Glucose-Capillary: 109 — ABNORMAL HIGH
Glucose-Capillary: 112 — ABNORMAL HIGH
Glucose-Capillary: 125 — ABNORMAL HIGH
Glucose-Capillary: 130 — ABNORMAL HIGH

## 2010-10-28 LAB — POCT CARDIAC MARKERS
CKMB, poc: <1 — ABNORMAL LOW
Myoglobin, poc: 93.2
Operator id: 244461
Troponin i, poc: <0.05

## 2010-10-28 LAB — DIGOXIN LEVEL: Digoxin Level: <0.2 — ABNORMAL LOW

## 2010-10-28 LAB — B-NATRIURETIC PEPTIDE (CONVERTED LAB)
Pro B Natriuretic peptide (BNP): 434 — ABNORMAL HIGH
Pro B Natriuretic peptide (BNP): 586 — ABNORMAL HIGH
Pro B Natriuretic peptide (BNP): 608 — ABNORMAL HIGH

## 2010-10-28 LAB — DIFFERENTIAL
Basophils Absolute: 0
Basophils Relative: 0
Eosinophils Absolute: 0.2
Eosinophils Relative: 4
Lymphocytes Relative: 27
Lymphs Abs: 1.2
Monocytes Absolute: 0.5
Monocytes Relative: 11
Neutro Abs: 2.6
Neutrophils Relative %: 58

## 2010-10-28 LAB — CK TOTAL AND CKMB (NOT AT ARMC)
CK, MB: 3.9
Relative Index: 3.6 — ABNORMAL HIGH
Total CK: 107

## 2010-10-28 LAB — POCT I-STAT, CHEM 8
Calcium, Ion: 1.25
HCT: 43
Hemoglobin: 14.6
TCO2: 29

## 2010-10-28 LAB — CULTURE, BLOOD (ROUTINE X 2): Culture: NO GROWTH

## 2010-10-31 LAB — URINALYSIS, ROUTINE W REFLEX MICROSCOPIC
Bilirubin Urine: NEGATIVE
Glucose, UA: NEGATIVE
Hgb urine dipstick: NEGATIVE
Specific Gravity, Urine: 1.028
Urobilinogen, UA: 0.2

## 2010-10-31 LAB — DIFFERENTIAL
Basophils Absolute: 0
Basophils Relative: 0
Lymphocytes Relative: 29
Monocytes Absolute: 0.4
Neutro Abs: 2.5
Neutrophils Relative %: 57

## 2010-10-31 LAB — POCT I-STAT, CHEM 8
Calcium, Ion: 1.06 — ABNORMAL LOW
Chloride: 117 — ABNORMAL HIGH
Glucose, Bld: 144 — ABNORMAL HIGH
HCT: 42
Hemoglobin: 14.3
TCO2: 27

## 2010-10-31 LAB — URINE MICROSCOPIC-ADD ON

## 2010-10-31 LAB — URINE CULTURE
Colony Count: NO GROWTH
Culture: NO GROWTH

## 2010-10-31 LAB — CBC
Hemoglobin: 14.1
MCHC: 32.5
RBC: 4.83
RDW: 15.1

## 2010-10-31 LAB — POTASSIUM: Potassium: 3.6

## 2010-11-04 LAB — COMPREHENSIVE METABOLIC PANEL
ALT: 14
ALT: 13
AST: 20
Albumin: 3.6
Albumin: 3.8
Albumin: 3.9
Alkaline Phosphatase: 45
Alkaline Phosphatase: 45
BUN: 23
BUN: 25 — ABNORMAL HIGH
CO2: 27
Calcium: 9.6
Chloride: 104
Chloride: 106
Creatinine, Ser: 1.94 — ABNORMAL HIGH
GFR calc non Af Amer: 33 — ABNORMAL LOW
Glucose, Bld: 131 — ABNORMAL HIGH
Glucose, Bld: 167 — ABNORMAL HIGH
Potassium: 4.3
Potassium: 4.5
Sodium: 141
Sodium: 138
Total Bilirubin: 0.6
Total Bilirubin: 0.7
Total Protein: 7.3
Total Protein: 7

## 2010-11-04 LAB — BASIC METABOLIC PANEL
Calcium: 9.5
GFR calc non Af Amer: 34 — ABNORMAL LOW
Glucose, Bld: 123 — ABNORMAL HIGH
Sodium: 139

## 2010-11-04 LAB — B-NATRIURETIC PEPTIDE (CONVERTED LAB)
Pro B Natriuretic peptide (BNP): 51
Pro B Natriuretic peptide (BNP): 88.1

## 2010-11-07 LAB — COMPREHENSIVE METABOLIC PANEL
ALT: 12
ALT: 12
ALT: 12
AST: 17
AST: 18
AST: 21
Albumin: 3.5
Albumin: 3.6
Albumin: 4
Alkaline Phosphatase: 44
Alkaline Phosphatase: 53
Alkaline Phosphatase: 53
Alkaline Phosphatase: 54
BUN: 27 — ABNORMAL HIGH
BUN: 36 — ABNORMAL HIGH
BUN: 47 — ABNORMAL HIGH
CO2: 26
CO2: 27
Calcium: 9.4
Calcium: 9.7
Chloride: 100
Chloride: 104
Creatinine, Ser: 1.88 — ABNORMAL HIGH
Creatinine, Ser: 1.95 — ABNORMAL HIGH
Creatinine, Ser: 2.27 — ABNORMAL HIGH
Creatinine, Ser: 2.71 — ABNORMAL HIGH
GFR calc Af Amer: 34 — ABNORMAL LOW
GFR calc Af Amer: 40 — ABNORMAL LOW
GFR calc non Af Amer: 29 — ABNORMAL LOW
GFR calc non Af Amer: 35 — ABNORMAL LOW
Glucose, Bld: 112 — ABNORMAL HIGH
Glucose, Bld: 116 — ABNORMAL HIGH
Glucose, Bld: 131 — ABNORMAL HIGH
Potassium: 4.4
Potassium: 4.8
Potassium: 4.9
Potassium: 4.9
Potassium: 5.3 — ABNORMAL HIGH
Sodium: 137
Sodium: 138
Sodium: 141
Total Bilirubin: 0.5
Total Bilirubin: 0.8
Total Protein: 6.2
Total Protein: 6.6
Total Protein: 6.8
Total Protein: 7
Total Protein: 7.1
Total Protein: 7.4

## 2010-11-07 LAB — CBC
HCT: 35.6 — ABNORMAL LOW
MCHC: 32.8
MCHC: 33.6
MCHC: 33.8
MCV: 88.5
MCV: 89.5
Platelets: 207
RBC: 3.98 — ABNORMAL LOW
RDW: 14
RDW: 14.4
WBC: 11.6 — ABNORMAL HIGH
WBC: 4.6

## 2010-11-07 LAB — URINALYSIS, ROUTINE W REFLEX MICROSCOPIC
Glucose, UA: NEGATIVE
Hgb urine dipstick: NEGATIVE
Nitrite: NEGATIVE
Protein, ur: 30 — AB
Urobilinogen, UA: 0.2
pH: 7

## 2010-11-07 LAB — AMMONIA: Ammonia: 25

## 2010-11-07 LAB — DIFFERENTIAL
Basophils Absolute: 0
Lymphocytes Relative: 10 — ABNORMAL LOW
Neutro Abs: 9.5 — ABNORMAL HIGH
Neutrophils Relative %: 82 — ABNORMAL HIGH

## 2010-11-07 LAB — BASIC METABOLIC PANEL
BUN: 31 — ABNORMAL HIGH
CO2: 26
Calcium: 9.4
Creatinine, Ser: 1.86 — ABNORMAL HIGH
Glucose, Bld: 111 — ABNORMAL HIGH

## 2010-11-07 LAB — CK TOTAL AND CKMB (NOT AT ARMC): CK, MB: 3

## 2010-11-07 LAB — POCT CARDIAC MARKERS
CKMB, poc: 1.9
Troponin i, poc: <0.05
Troponin i, poc: <0.05

## 2010-11-07 LAB — DIGOXIN LEVEL
Digoxin Level: 0.7 — ABNORMAL LOW
Digoxin Level: 0.8

## 2010-11-07 LAB — B-NATRIURETIC PEPTIDE (CONVERTED LAB): Pro B Natriuretic peptide (BNP): <30

## 2010-11-07 LAB — CARDIAC PANEL(CRET KIN+CKTOT+MB+TROPI)
CK, MB: 2.7
Total CK: 97

## 2010-11-07 LAB — MAGNESIUM: Magnesium: 2.3

## 2010-11-07 LAB — URINE MICROSCOPIC-ADD ON: Urine-Other: NONE SEEN

## 2010-11-08 LAB — DIFFERENTIAL
Basophils Relative: 0
Eosinophils Absolute: 0.2
Lymphs Abs: 1.5
Monocytes Absolute: 0.5
Monocytes Relative: 11
Neutrophils Relative %: 54

## 2010-11-08 LAB — URINALYSIS, ROUTINE W REFLEX MICROSCOPIC
Bilirubin Urine: NEGATIVE
Ketones, ur: NEGATIVE
Nitrite: NEGATIVE
Protein, ur: NEGATIVE
Urobilinogen, UA: 0.2
pH: 5.5

## 2010-11-08 LAB — COMPREHENSIVE METABOLIC PANEL
ALT: 12
Albumin: 4
Alkaline Phosphatase: 41
Calcium: 9.3
GFR calc Af Amer: 41 — ABNORMAL LOW
Potassium: 4.9
Sodium: 141
Total Protein: 7.1

## 2010-11-08 LAB — CBC
Hemoglobin: 12.4 — ABNORMAL LOW
MCHC: 33.9
Platelets: 214
RDW: 14.3

## 2012-03-15 ENCOUNTER — Encounter (HOSPITAL_COMMUNITY): Admission: RE | Payer: Self-pay | Source: Ambulatory Visit

## 2012-03-15 ENCOUNTER — Ambulatory Visit (HOSPITAL_COMMUNITY): Admission: RE | Admit: 2012-03-15 | Payer: Self-pay | Source: Ambulatory Visit | Admitting: Internal Medicine

## 2012-03-15 SURGERY — ECHOCARDIOGRAM, TRANSESOPHAGEAL
Anesthesia: Moderate Sedation

## 2013-04-08 ENCOUNTER — Telehealth (HOSPITAL_COMMUNITY): Payer: Self-pay

## 2013-09-12 ENCOUNTER — Telehealth: Payer: Self-pay | Admitting: Family

## 2013-09-14 NOTE — Telephone Encounter (Signed)
Opened in error

## 2013-09-15 ENCOUNTER — Telehealth: Payer: Self-pay
